# Patient Record
Sex: Male | Born: 1985 | Race: Black or African American | Hispanic: No | Marital: Single | Smoking: Former smoker
Health system: Southern US, Community
[De-identification: ages and names within clinical notes are randomized; demographics above are authoritative.]

---
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
## 2004-11-12 ENCOUNTER — Inpatient Hospital Stay (HOSPITAL_COMMUNITY): Admission: EM | Admit: 2004-11-12 | Discharge: 2004-11-15 | Payer: Self-pay | Admitting: Emergency Medicine

## 2004-11-14 ENCOUNTER — Ambulatory Visit: Payer: Self-pay | Admitting: *Deleted

## 2006-01-15 ENCOUNTER — Emergency Department (HOSPITAL_COMMUNITY): Admission: EM | Admit: 2006-01-15 | Discharge: 2006-01-15 | Payer: Self-pay | Admitting: Emergency Medicine

## 2007-10-15 ENCOUNTER — Emergency Department (HOSPITAL_COMMUNITY): Admission: EM | Admit: 2007-10-15 | Discharge: 2007-10-15 | Payer: Self-pay | Admitting: Emergency Medicine

## 2008-11-11 ENCOUNTER — Emergency Department (HOSPITAL_COMMUNITY): Admission: EM | Admit: 2008-11-11 | Discharge: 2008-11-11 | Payer: Self-pay | Admitting: Emergency Medicine

## 2009-03-14 ENCOUNTER — Emergency Department (HOSPITAL_COMMUNITY): Admission: EM | Admit: 2009-03-14 | Discharge: 2009-03-14 | Payer: Self-pay | Admitting: Orthopaedic Surgery

## 2009-12-11 ENCOUNTER — Emergency Department (HOSPITAL_COMMUNITY): Admission: EM | Admit: 2009-12-11 | Discharge: 2009-12-11 | Payer: Self-pay | Admitting: Emergency Medicine

## 2010-09-21 ENCOUNTER — Inpatient Hospital Stay (HOSPITAL_COMMUNITY)
Admission: AD | Admit: 2010-09-21 | Discharge: 2010-09-21 | Payer: Self-pay | Source: Home / Self Care | Attending: Family Medicine | Admitting: Family Medicine

## 2010-12-01 LAB — POCT CARDIAC MARKERS

## 2011-01-28 NOTE — Discharge Summary (Signed)
[name] [name], [name] [name]                ACCOUNT NO.:  [id number]   MEDICAL RECORD NO.:  [id number]          PATIENT TYPE:  INP   LOCATION:  4740                         FACILITY:  [facility]   PHYSICIAN:  Isidor Holts, M.D.  DATE OF BIRTH:  1986-06-18   DATE OF ADMISSION:  11/12/2004  DATE OF DISCHARGE:  11/15/2004                                 DISCHARGE SUMMARY   DISCHARGE DIAGNOSES:  1.  Suicide attempt.  2.  Ingestion of oxycodone and Clorox Bleach, November 12, 2004.  3.  Previous history of Tylenol overdose, August 13, 2001.  4.  Depression.   DISCHARGE MEDICATIONS:  Protonix 40 mg p.o. daily.   PROCEDURE:  1.  Chest x-ray on November 12, 2004.  This showed no acute disease.  2.  Head CT scan dated November 12, 2004.  This showed mild frontal scalp soft      tissue swelling with no acute intracranial findings.   CONSULTATIONS:  Jasmine Pang, M.D., psychiatry.   HISTORY OF PRESENT ILLNESS:  As in H&P notes of November 12, 2004.  However,  briefly, this is a 25 year old African-American male with a known history of  previous Tylenol overdose/suicide attempt in December of 2002, who presents  following ingestion of Oxycodone and unspecified amount of Clorox bleach in  the a.m. of November 12, 2004, following argument with his probation officer as  well as the minister he was staying with.  Apparently, he had admitted to  suicidal intent.  The patient was admitted for evaluation, investigation,  and management.   HOSPITAL COURSE:  Problem 1.  Oxycodone ingestion/Clorox bleach ingestion.  According to the patient he only took one tablet of oxycodone which he had  obtained from his girlfriend and did not appear to have clinical evidence of  respiratory depression.  He was treated with GI cocktail as well as proton  pump inhibitor for Clorox bleach ingestion and certainly had no vomiting,  hematemesis, or melena during the course of his hospital stay and did not  experience abdominal pain.   Hemoglobin levels were monitored serially and as  of a.m. of November 12, 2004, hemoglobin is stable/normal at 12.9 grams/dL.  The  patient shows no evidence of hemodynamic instability.  Blood pressures  normal at 136/79 mmHg.  Pulse rate is normal at 82 per minute.  The patient  is completely asymptomatic, with no evidence of postural dizziness or  abdominal pain to suggest internal bleed.   Problem 2.  Depression.  The patient was placed under observation with a one-  on-one sitter during the course of his hospital stay.  His mood appeared  stable.  He was evaluated by psychiatry, i.e., Dr. Flo Shanks on November 14, 2004, and recommendation was the patient would benefit from a course of  inpatient management.   DISPOSITION:  The patient is medically cleared and is considered stable for  transfer to psychiatric facility for inpatient management as soon as this is  feasible.   PAIN MANAGEMENT:  Not applicable.   ACTIVITY:  No restrictions.   DIET:  No restrictions.   WOUND  CARE:  Not applicable.   FOLLOWUP:  Psychiatry.      CO/MEDQ  D:  11/15/2004  T:  11/15/2004  Job:  [id number]

## 2011-01-28 NOTE — H&P (Signed)
[facility]  Patient:    [name] [name], [name] [name] Visit Number: [id number] MRN: [id number]          Service Type: PSY Location: 200 0200 02 Attending Physician:  Veneta Penton. Dictated by:   Veneta Penton, M.D. Admit Date:  08/16/2001                     Psychiatric Admission Assessment  DATE OF ADMISSION:  August 16, 2001  PATIENT IDENTIFICATION:  This 25 year old black male was admitted complaining of depression after being involuntarily admitted status post overdose with Tylenol as a suicide attempt.  He was medically cleared and transferred to this facility.  HISTORY OF PRESENT ILLNESS:  The patient complains of an increasingly depressed, irritable, and angry mood most of the day nearly every day over the past one to two months along with anhedonia, decreased school performance, feelings of hopelessness, helplessness, worthlessness, decreased concentration and energy level, increased symptoms of fatigue, psychomotor agitation.  He has been increasingly isolative and withdrawn.  He admits to giving up on activities previously enjoyed.  He admits to insomnia, decreased appetite and weight loss.  He is unable to contract for safety at this time.  PAST PSYCHIATRIC HISTORY:  Inpatient and outpatient at [facility] in 2000 under the care of Dr. Ladona Ridgel.  He is currently on probation for trespassing and assault and he has had multiple previous incarcerations at the [facility] in [facility] for multiple previous criminal charges.  SUBSTANCE ABUSE HISTORY:  He currently denies any drug or alcohol use.  PAST MEDICAL HISTORY:  He denies any history of medical or surgical problems.  ALLERGIES:  TOMATOES.  He has no known drug allergies or sensitivities.  CURRENT MEDICATIONS:  He is on no current medications.  FAMILY AND SOCIAL HISTORY:  The patient resides with his mother.  There is no other family or  social history available at this time.  MENTAL STATUS EXAMINATION:  The patient presents as a well-developed, well-nourished, adolescent black male who is alert and oriented x 4, psychomotor agitated, and whose appearance is compatible with his stated age. He displays poor impulse control, decreased concentration and attention span. Affect and mood are depressed and irritable.  Immediate recall, short-term memory, and remote memory are intact.  Similarities and differences are within normal limits and he is able to abstract to proverbs.  He displays no evidence of a thought disorder.  Thought processes are goal directed.  ADMISSION DIAGNOSES: Axis I:    1. Major depression, recurrent type, severe without psychosis.            2. Conduct disorder.            3. Rule out attention-deficit/hyperactivity disorder. Axis II:   1. Antisocial traits.            2. Rule out personality disorder.            3. Rule out learning disorder, not otherwise specified. Axis III:  Status post Tylenol overdose. Axis IV:   Current psychosocial stressors are severe. Axis V:    20.  ASSETS AND STRENGTHS:  His mother is very supportive of him. He is well connected into the DSS system.  INITIAL PLAN OF CARE:  Begin the patient on a trial of antidepressant medication once informed consent is obtained and a risks/benefits discussion has been held.  At the present time, however, the patient refuses a trial of antidepressant medication.  Psychotherapy  will focus on improving his impulse control, decreasing cognitive distortions, and decreasing potential for harm to self and others.  A laboratory workup will also be initiated to rule out any medical problems contributing to his symptomatology.  ESTIMATED LENGTH OF STAY:  Five to seven days.  POST HOSPITAL CARE PLAN:  Discharge the patient to home.Dictated by:   Veneta Penton, M.D. Attending Physician:  Veneta Penton DD:  08/16/01 TD:   08/16/01 Job: [id number]

## 2011-01-28 NOTE — H&P (Signed)
[name], [name]                ACCOUNT NO.:  [id number]   MEDICAL RECORD NO.:  [id number]          PATIENT TYPE:  INP   LOCATION:  4740                         FACILITY:  [facility]   PHYSICIAN:  Gertha Calkin, M.D.DATE OF BIRTH:  Nov 16, 1985   DATE OF ADMISSION:  11/12/2004  DATE OF DISCHARGE:                                HISTORY & PHYSICAL   HISTORY OF PRESENT ILLNESS:  This is a pleasant, 25 year old, African-  American male with previous overdose/suicide attempt in December 2002, with  Tylenol who presents from an unstable home environment.  His long story  essentially involves his probation officer and one of the ministers that he  was staying with after being released from jail.  Essentially, he had a very  stressful event over the last 24 hours and states at this time, while he was  more awake, that he had taken one oxycodone that his girlfriend has  prescribed to her (just gave birth to a child) and drank an unknown amount  of Clorox bleach earlier this morning.  The patient states that he does not  remember because he feels like he blacked out, but no recollection of  hitting the ground and no scratches or bruises that he can recall or feel at  this time.  He states the last thing he remembers was having the argument  with his probation officer as well as the minister he was staying with and  then taking oxycodone and does not remember how much bleach he took.  In the  ED earlier, during this admission, ADP related that he did voice wanting to  end his life.  However, during my H&P, he is pleasant, laughing, joking and  does not appear to be depressed or wanting to end his life.  He is  definitely worried about being very stressed concerning all the litigation  that may be awaiting him after discharge.   PAST MEDICAL HISTORY:  Significant for previous admission on August 16, 2001, for another overdose with Tylenol at that time.  He was discharged  home with followup  outpatient psychiatry, but opted not to start on  antidepressant medications.  Otherwise, no past medical history.   FAMILY HISTORY:  Noncontributory.   SOCIAL HISTORY:  Recently left jail after spending 3 month sentence for  presumed arson.  He denies smoking, illicit drugs and no tobacco or alcohol  history.  No IV drug use.  He is single.  No kids.  He has a girlfriend.  He  lives here in [place].   REVIEW OF SYSTEMS:  Negative, except for HPI.   PHYSICAL EXAMINATION:  VITAL SIGNS:  Temperature 98.7, blood pressure  131/77, pulse 72, respirations 22, 99% on room air.  GENERAL:  This is a well-developed, well-nourished, African-American male  lying in bed, joking, laughing without any distress.  HEENT:  Unremarkable.  CARDIAC:  Regular rate and rhythm, no murmurs, rubs or gallops.  CHEST:  Clear to auscultation bilaterally.  Good air movement.  ABDOMEN:  Soft, nontender, nondistended, positive bowel sounds.  EXTREMITIES:  Without clubbing, cyanosis or edema.  NEUROLOGIC:  Cranial nerves 2-12 grossly intact.  Alert and oriented x3.   LABORATORY DATA AND X-RAY FINDINGS:  Hemoglobin 14.3, white count 6.8,  platelets 179, MCV 89.  UDS was negative for everything.  Alcohol, aspirin  and acetaminophen levels are negative.  CMET was essentially normal except  for glucose elevated at 134 with total protein and albumin slightly  decreased at 5.9 and 3.4.   CT of his head without contrast showed mild frontal scalp swelling, however,  there were no bruises or tenderness listed during my exam.   ASSESSMENT/PLAN:  1.  Suicide gesture/attempt.  2.  History of depression (the patient denied treatment).   PLAN:  Will admit for psychiatric assessment especially regarding these  episodes where he states that he will become so stressed that he will have a  blackout.  Otherwise treat his possible GI side effects of his Clorox  ingestion with GI cocktail and supportive therapy.  At this time,  I feel  that he is medically stable and pending his psychiatric assessment and  social work consult, which will hopefully help him get into a more stable  environment, he should be ready for discharge when these things are  addressed.      JD/MEDQ  D:  11/12/2004  T:  11/13/2004  Job:  [id number]

## 2011-01-28 NOTE — Discharge Summary (Signed)
[facility]  Patient:    [name] [name], [name] [name] Visit Number: [id number] MRN: [id number]          Service Type: EMS Location: [place] Attending Physician:  Doug Sou Dictated by:   Veneta Penton, M.D. Admit Date:  08/16/2001 Discharge Date: 08/16/2001                             Discharge Summary  REASON FOR ADMISSION:  This 25 year old black male was admitted for depression after being involuntarily admitted status post overdose with Tylenol as a suicide attempt.  For further history of present illness, please see the patients psychiatric admission assessment.  PHYSICAL EXAMINATION AT THE TIME OF ADMISSION:  Entirely unremarkable.  LABORATORY EXAMINATION:  The patient underwent a laboratory workup to rule out any medical problems contributing to his symptomatology.  UA was unremarkable. Urine probe for gonorrhea was negative.  Urine probe for chlamydia was positive and the patient was given 1 g Zithromax p.o. in a one-time dose. Hepatic panel showed indirect bilirubin 1.1, direct bilirubin 0.1, and was otherwise unremarkable.  Basic metabolic panel was within normal limits.  CBC was within normal limits.  TSH and free T4 are pending at the time of discharge.  GGT was unremarkable.  RPR is pending at the time of discharge. Routine metabolic panel was within normal limits.  Urine drug screen was negative.  The patient received no x-rays, no special procedures, no additional consultations.  He sustained no complications during the course of this hospitalization.  HOSPITAL COURSE:  The patient rapidly adapted to unit routine, socializing well with both patients and staff.  On admission, his affect and mood were depressed.  He was psychomotor agitated.  He displayed poor impulse control and decreased concentration.  He participated in all aspects of the therapeutic treatment program.  He has denied any homicidal or suicidal ideation since admission,  has shown no assaultive or aggressive behavior. Consequently, it is felt he has reached his maximum benefits of hospitalization and is ready for discharge to a less restrictive alternative setting.  He continues to be guarded and superficial in group therapy but is motivated for outpatient therapy and no longer appears to be danger to himself or others.  Consequently, he will be discharged to home.  CONDITION ON DISCHARGE:  Improved.  DIAGNOSES: Axis I:    1. Major depression, recurrent type, severe without psychosis.            2. Conduct disorder.            3. Rule out attention-deficit/hyperactivity disorder. Axis II:   1. Antisocial traits.            2. Rule out personality disorder, not otherwise specified.            3. Rule out learning disorder, not otherwise specified. Axis III:  Status post Tylenol overdose as a suicide attempt. Axis IV:   Current psychosocial stressors are severe. Axis V:    20 on admission, 30 on discharge.  FURTHER EVALUATION AND TREATMENT RECOMMENDATIONS: 1. The patient is discharged to home. 2. He is discharged on an unrestricted level of activity and a regular diet. 3. He has refused a trial of antidepressant medication and is consequently    discharged on no medicines. 4. He will follow up with his outpatient psychiatrist for all further aspects    of his psychiatric care and consequently, I will sign off on the case  at    this time. Dictated by:   Veneta Penton, M.D. Attending Physician:  Doug Sou DD:  08/21/01 TD:  08/21/01 Job: [id number]

## 2011-06-03 LAB — I-STAT 8, (EC8 V) (CONVERTED LAB)
Acid-Base Excess: 2
BUN: 13
Chloride: 106
Glucose, Bld: 98
Hemoglobin: 15.6
pH, Ven: 7.376 — ABNORMAL HIGH

## 2011-06-03 LAB — POCT CARDIAC MARKERS
Myoglobin, poc: 51.8
Operator id: 151321

## 2011-06-03 LAB — POCT I-STAT CREATININE: Creatinine, Ser: 1.1

## 2011-06-19 ENCOUNTER — Emergency Department (HOSPITAL_COMMUNITY)
Admission: EM | Admit: 2011-06-19 | Discharge: 2011-06-19 | Disposition: A | Discharge status: Home / Self Care | Payer: Self-pay | Attending: Emergency Medicine | Admitting: Emergency Medicine

## 2011-06-19 DIAGNOSIS — M545 Low back pain, unspecified: Secondary | ICD-10-CM | POA: Insufficient documentation

## 2011-06-19 DIAGNOSIS — M5430 Sciatica, unspecified side: Secondary | ICD-10-CM | POA: Insufficient documentation

## 2011-06-19 LAB — URINALYSIS, ROUTINE W REFLEX MICROSCOPIC
Bilirubin Urine: NEGATIVE
Glucose, UA: NEGATIVE mg/dL
Hgb urine dipstick: NEGATIVE
Specific Gravity, Urine: 1.023 (ref 1.005–1.030)
pH: 6 (ref 5.0–8.0)

## 2012-02-15 ENCOUNTER — Encounter (HOSPITAL_COMMUNITY): Payer: Self-pay | Admitting: *Deleted

## 2012-02-15 ENCOUNTER — Emergency Department (HOSPITAL_COMMUNITY)
Admission: EM | Admit: 2012-02-15 | Discharge: 2012-02-15 | Disposition: A | Discharge status: Home / Self Care | Payer: Self-pay | Attending: Emergency Medicine | Admitting: Emergency Medicine

## 2012-02-15 DIAGNOSIS — B86 Scabies: Secondary | ICD-10-CM | POA: Insufficient documentation

## 2012-02-15 MED ORDER — PERMETHRIN 5 % EX CREA: TOPICAL_CREAM | CUTANEOUS | Status: AC

## 2012-02-15 NOTE — ED Notes (Signed)
Pt was exposed to scabies at work.  Pt reports itching and has rash between fingers.  VS WNL.

## 2012-02-15 NOTE — Discharge Instructions (Signed)
Scabies Scabies are small bugs (mites) that burrow under the skin and cause red bumps and severe itching. These bugs can only be seen with a microscope. Scabies are highly contagious. They can spread easily from person to person by direct contact. They are also spread through sharing clothing or linens that have the scabies mites living in them. It is not unusual for an entire family to become infected through shared towels, clothing, or bedding.  HOME CARE INSTRUCTIONS   Your caregiver may prescribe a cream or lotion to kill the mites. If this cream is prescribed; massage the cream into the entire area of the body from the neck to the bottom of both feet. Also massage the cream into the scalp and face if your child is less than 1 year old. Avoid the eyes and mouth.   Leave the cream on for 8 to12 hours. Do not wash your hands after application. Your child should bathe or shower after the 8 to 12 hour application period. Sometimes it is helpful to apply the cream to your child at right before bedtime.   One treatment is usually effective and will eliminate approximately 95% of infestations. For severe cases, your caregiver may decide to repeat the treatment in 1 week. Everyone in your household should be treated with one application of the cream.   New rashes or burrows should not appear after successful treatment within 24 to 48 hours; however the itching and rash may last for 2 to 4 weeks after successful treatment. If your symptoms persist longer than this, see your caregiver.   Your caregiver also may prescribe a medication to help with the itching or to help the rash go away more quickly.   Scabies can live on clothing or linens for up to 3 days. Your entire child's recently used clothing, towels, stuffed toys, and bed linens should be washed in hot water and then dried in a dryer for at least 20 minutes on high heat. Items that cannot be washed should be enclosed in a plastic bag for at least 3  days.   To help relieve itching, bathe your child in a cool bath or apply cool washcloths to the affected areas.   Your child may return to school after treatment with the prescribed cream.  SEEK MEDICAL CARE IF:   The itching persists longer than 4 weeks after treatment.   The rash spreads or becomes infected (the area has red blisters or yellow-tan crust).  Document Released: 08/29/2005 Document Revised: 08/18/2011 Document Reviewed: 01/07/2009 Exit[employer] Patient Information 2012 [employer]. 

## 2012-02-17 NOTE — ED Provider Notes (Signed)
History     CSN: [id number]  Arrival date & time 02/15/12  1254   First MD Initiated Contact with Patient 02/15/12 1317      Chief Complaint  Patient presents with  . Rash    (Consider location/radiation/quality/duration/timing/severity/associated sxs/prior treatment) HPI Comments: 62 y who presents for concern of scabies.  Pt with puritic rash to arms and fingers and abd.  Pt with no fevers, no drainage. No induration, no boils  Patient is a 26 y.o. male presenting with rash. The history is provided by the patient. No language interpreter was used.  Rash  This is a new problem. The current episode started more than 2 days ago. The problem has been gradually worsening. The problem is associated with an insect bite/sting. There has been no fever. The rash is present on the left hand, abdomen, right arm, right hand and left arm. The patient is experiencing no pain. The pain has been constant since onset. Associated symptoms include itching. Pertinent negatives include no blisters and no pain. He has tried nothing for the symptoms. The treatment provided no relief.    History reviewed. No pertinent past medical history.  History reviewed. No pertinent past surgical history.  No family history on file.  History  Substance Use Topics  . Smoking status: Not on file  . Smokeless tobacco: Not on file  . Alcohol Use: Not on file      Review of Systems  Skin: Positive for itching and rash.  All other systems reviewed and are negative.    Allergies  Review of patient's allergies indicates no known allergies.  Home Medications   Current Outpatient Rx  Name Route Sig Dispense Refill  . PERMETHRIN 5 % EX CREA  Apply to affected area once, then reapply in one week 60 g 0    BP 124/70  Pulse 69  Temp(Src) 97.9 F (36.6 C) (Oral)  Resp 18  SpO2 100%  Physical Exam  Nursing note and vitals reviewed. Constitutional: He is oriented to person, place, and time. He appears  well-developed and well-nourished.  HENT:  Head: Normocephalic and atraumatic.  Eyes: Conjunctivae and EOM are normal.  Neck: Normal range of motion. Neck supple.  Cardiovascular: Normal rate and normal heart sounds.   Pulmonary/Chest: Effort normal and breath sounds normal.  Abdominal: Soft. Bowel sounds are normal.  Musculoskeletal: Normal range of motion.  Neurological: He is alert and oriented to person, place, and time.  Skin: Skin is warm.       Multiple scatter areas of small pinpoint papules on hands arms, webs of finger and some noted on stomach    ED Course  Procedures (including critical care time)  Labs Reviewed - No data to display No results found.   1. Scabies       MDM  63 y with scabies.  Will give permethrin.  Discussed need to retreat in 1 week, and need to clean furniture, bedding, clothes, etc.  Discussed signs that warrant reevaluation.          Chrystine Oiler, MD 02/17/12 [phone]

## 2012-03-03 ENCOUNTER — Encounter (HOSPITAL_COMMUNITY): Payer: Self-pay | Admitting: *Deleted

## 2012-03-03 ENCOUNTER — Emergency Department (HOSPITAL_COMMUNITY)
Admission: EM | Admit: 2012-03-03 | Discharge: 2012-03-03 | Discharge status: Against Medical Advice | Payer: Self-pay | Attending: Emergency Medicine | Admitting: Emergency Medicine

## 2012-03-03 ENCOUNTER — Emergency Department (HOSPITAL_COMMUNITY): Payer: Self-pay

## 2012-03-03 DIAGNOSIS — S0003XA Contusion of scalp, initial encounter: Secondary | ICD-10-CM | POA: Insufficient documentation

## 2012-03-03 DIAGNOSIS — W2209XA Striking against other stationary object, initial encounter: Secondary | ICD-10-CM | POA: Insufficient documentation

## 2012-03-03 DIAGNOSIS — S0990XA Unspecified injury of head, initial encounter: Secondary | ICD-10-CM

## 2012-03-03 DIAGNOSIS — Y92009 Unspecified place in unspecified non-institutional (private) residence as the place of occurrence of the external cause: Secondary | ICD-10-CM | POA: Insufficient documentation

## 2012-03-03 NOTE — ED Notes (Signed)
PA at bedside.

## 2012-03-03 NOTE — ED Notes (Signed)
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  No loc, but pt c/o dizziness (room is moving).

## 2012-03-03 NOTE — ED Notes (Signed)
Pt upset with signifcant other and walked out and stated he is lieaving because his SO is giving him a headache.

## 2012-03-04 NOTE — ED Provider Notes (Signed)
History     CSN: [id number]  Arrival date & time 03/03/12  0037   First MD Initiated Contact with Patient 03/03/12 0144      Chief Complaint  Patient presents with  . Head Injury    (Consider location/radiation/quality/duration/timing/severity/associated sxs/prior treatment) HPI Comments: Patient here after unloading a truck when he turned around and struck his forehead on the frame of the trailer just at the hairline - no laceration noted, no LOC but reports continued dizziness.  Denies neck and back pain, blurred vision, numbness, tingling.  Patient is a 26 y.o. male presenting with head injury. The history is provided by the patient. No language interpreter was used.  Head Injury  The incident occurred 3 to 5 hours ago. He came to the ER via walk-in. The injury mechanism was a direct blow. There was no loss of consciousness. There was no blood loss. The quality of the pain is described as sharp. The pain is at a severity of 5/10. The pain is moderate. The pain has been constant since the injury. Pertinent negatives include no numbness, no blurred vision, no vomiting, no tinnitus, patient does not experience disorientation and no memory loss. He was found conscious by EMS personnel.    History reviewed. No pertinent past medical history.  History reviewed. No pertinent past surgical history.  No family history on file.  History  Substance Use Topics  . Smoking status: Former Games developer  . Smokeless tobacco: Not on file  . Alcohol Use: No      Review of Systems  Constitutional: Negative for fever and chills.  HENT: Negative for neck pain and tinnitus.   Eyes: Negative for blurred vision and pain.  Respiratory: Negative for chest tightness and shortness of breath.   Cardiovascular: Negative for chest pain.  Gastrointestinal: Negative for vomiting and abdominal pain.  Genitourinary: Negative for flank pain.  Musculoskeletal: Negative for back pain.  Neurological: Positive for  dizziness and headaches. Negative for numbness.  Psychiatric/Behavioral: Negative for memory loss.  All other systems reviewed and are negative.    Allergies  Review of patient's allergies indicates no known allergies.  Home Medications  No current outpatient prescriptions on file.  BP 143/90  Pulse 57  Temp 98.3 F (36.8 C) (Oral)  Resp 18  SpO2 99%  Physical Exam  Nursing note and vitals reviewed. Constitutional: He is oriented to person, place, and time. He appears well-developed and well-nourished. No distress.  HENT:  Head: Normocephalic.  Right Ear: External ear normal.  Left Ear: External ear normal.  Nose: Nose normal.  Mouth/Throat: Oropharynx is clear and moist. No oropharyngeal exudate.       Contusion to forehead at the hairline   Eyes: Conjunctivae are normal. Pupils are equal, round, and reactive to light. No scleral icterus.  Neck: Normal range of motion. Neck supple.  Cardiovascular: Normal rate, regular rhythm and normal heart sounds.  Exam reveals no gallop and no friction rub.   No murmur heard. Pulmonary/Chest: Effort normal and breath sounds normal. No respiratory distress. He has no wheezes. He has no rales. He exhibits no tenderness.  Abdominal: Soft. Bowel sounds are normal. He exhibits no distension. There is no tenderness.  Musculoskeletal: Normal range of motion. He exhibits no edema and no tenderness.  Lymphadenopathy:    He has no cervical adenopathy.  Neurological: He is alert and oriented to person, place, and time. No cranial nerve deficit. He exhibits normal muscle tone. Coordination normal.  Skin: Skin is warm and  dry. No rash noted. No erythema. No pallor.  Psychiatric: He has a normal mood and affect. His behavior is normal. Judgment and thought content normal.    ED Course  Procedures (including critical care time)  Labs Reviewed - No data to display No results found.   1. Minor head injury       MDM  I had ordered CT scan  of his head, but the patient became so frustrated with his girlfriend who was insisting on lying in the bed that he ended up leaving AMA without telling staff.       Izola Price Springer, [place] 03/04/12 [phone]

## 2012-03-04 NOTE — ED Provider Notes (Signed)
Medical screening examination/treatment/procedure(s) were performed by non-physician practitioner and as supervising physician I was immediately available for consultation/collaboration.   Dayton Bailiff, MD 03/04/12 [phone]

## 2012-06-17 ENCOUNTER — Emergency Department (HOSPITAL_COMMUNITY)
Admission: EM | Admit: 2012-06-17 | Discharge: 2012-06-17 | Disposition: A | Discharge status: Home / Self Care | Payer: Self-pay | Attending: Emergency Medicine | Admitting: Emergency Medicine

## 2012-06-17 ENCOUNTER — Encounter (HOSPITAL_COMMUNITY): Payer: Self-pay | Admitting: *Deleted

## 2012-06-17 DIAGNOSIS — R252 Cramp and spasm: Secondary | ICD-10-CM | POA: Insufficient documentation

## 2012-06-17 DIAGNOSIS — E860 Dehydration: Secondary | ICD-10-CM | POA: Insufficient documentation

## 2012-06-17 DIAGNOSIS — E876 Hypokalemia: Secondary | ICD-10-CM | POA: Insufficient documentation

## 2012-06-17 DIAGNOSIS — Z87891 Personal history of nicotine dependence: Secondary | ICD-10-CM | POA: Insufficient documentation

## 2012-06-17 DIAGNOSIS — R112 Nausea with vomiting, unspecified: Secondary | ICD-10-CM | POA: Insufficient documentation

## 2012-06-17 LAB — POCT I-STAT, CHEM 8
BUN: 22 mg/dL (ref 6–23)
Creatinine, Ser: 1.5 mg/dL — ABNORMAL HIGH (ref 0.50–1.35)
Glucose, Bld: 75 mg/dL (ref 70–99)
HCT: 46 % (ref 39.0–52.0)
Hemoglobin: 15.6 g/dL (ref 13.0–17.0)
Potassium: 3 mEq/L — ABNORMAL LOW (ref 3.5–5.1)
TCO2: 22 mmol/L (ref 0–100)

## 2012-06-17 LAB — CBC
HCT: 38.7 % — ABNORMAL LOW (ref 39.0–52.0)
Hemoglobin: 13.9 g/dL (ref 13.0–17.0)
MCH: 32.6 pg (ref 26.0–34.0)
MCV: 90.8 fL (ref 78.0–100.0)
RDW: 13 % (ref 11.5–15.5)
WBC: 14.6 10*3/uL — ABNORMAL HIGH (ref 4.0–10.5)

## 2012-06-17 LAB — COMPREHENSIVE METABOLIC PANEL
ALT: 25 U/L (ref 0–53)
Albumin: 4.6 g/dL (ref 3.5–5.2)
Chloride: 101 mEq/L (ref 96–112)
GFR calc Af Amer: 76 mL/min — ABNORMAL LOW (ref >90)
Sodium: 139 mEq/L (ref 135–145)
Total Bilirubin: 0.5 mg/dL (ref 0.3–1.2)
Total Protein: 7.6 g/dL (ref 6.0–8.3)

## 2012-06-17 LAB — URINE MICROSCOPIC-ADD ON

## 2012-06-17 LAB — URINALYSIS, ROUTINE W REFLEX MICROSCOPIC
Glucose, UA: NEGATIVE mg/dL
Leukocytes, UA: NEGATIVE
Specific Gravity, Urine: 1.029 (ref 1.005–1.030)

## 2012-06-17 MED ORDER — POTASSIUM CHLORIDE CRYS ER 20 MEQ PO TBCR
40.0000 meq | EXTENDED_RELEASE_TABLET | Freq: Once | ORAL | Status: AC
  Administered 2012-06-17: 40 meq via ORAL
  Filled 2012-06-17: qty 2

## 2012-06-17 MED ORDER — DIAZEPAM 5 MG PO TABS
5.0000 mg | ORAL_TABLET | Freq: Once | ORAL | Status: AC
  Administered 2012-06-17: 5 mg via ORAL
  Filled 2012-06-17: qty 1

## 2012-06-17 MED ORDER — SODIUM CHLORIDE 0.9 % IV SOLN
INTRAVENOUS | Status: DC
  Administered 2012-06-17: 02:00:00 via INTRAVENOUS

## 2012-06-17 MED ORDER — SODIUM CHLORIDE 0.9 % IV BOLUS (SEPSIS)
1000.0000 mL | Freq: Once | INTRAVENOUS | Status: AC
  Administered 2012-06-17: 1000 mL via INTRAVENOUS

## 2012-06-17 MED ORDER — ONDANSETRON HCL 4 MG PO TABS: 4.0000 mg | ORAL_TABLET | Freq: Four times a day (QID) | ORAL | Status: DC

## 2012-06-17 MED ORDER — ONDANSETRON HCL 4 MG/2ML IJ SOLN
4.0000 mg | Freq: Once | INTRAMUSCULAR | Status: AC
  Administered 2012-06-17: 4 mg via INTRAVENOUS
  Filled 2012-06-17: qty 2

## 2012-06-17 NOTE — ED Notes (Signed)
Patient currently asleep in bed; no respiratory or acute distress noted.  Family updated on plan of care; informed patient that we are currently waiting on further orders/disposition from EDP.  Family member given coffee to drink.  Family has no other questions or concerns at this time; will continue to monitor.

## 2012-06-17 NOTE — ED Notes (Signed)
Phlebotomist currently at bedside. 

## 2012-06-17 NOTE — ED Notes (Signed)
Patient currently resting quietly in bed; no respiratory or acute distress noted.  Patient updated on plan of care; informed patient that we need a urine sample.  Patient states that he is unable to go at this time; urinal placed at bedside.  Patient has no other questions or concerns at this time; will continue to monitor.

## 2012-06-17 NOTE — ED Notes (Signed)
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  Patient reports poor oral fluid intake today; patient has a history of muscle cramps due (unsure of diagnosis in the past).  Patient denies chest pain, shortness of breath, and dizziness.  Reports nausea; vomited x1 upon arrival to ED.  Patient alert and oriented x4; PERRL present.  Upon arrival to room, patient changed into gown and connected to continuous cardiac, pulse ox, and blood pressure monitor.  Will continue to monitor.

## 2012-06-17 NOTE — ED Notes (Signed)
Patient vomited x1 again; Dr. Dierdre Highman notified that patient requesting more nausea medication.  Will continue to monitor.

## 2012-06-17 NOTE — ED Notes (Signed)
Patient currently resting quietly in bed; no respiratory or acute distress noted.  Patient updated on plan of care; informed patient that we are currently waiting on further orders from EDP; patient has no other questions or concerns at this time; will continue to monitor.

## 2012-06-17 NOTE — ED Notes (Signed)
Patient currently asleep in bed; no respiratory or acute distress noted.  Family present at bedside.  Will continue to monitor. 

## 2012-06-17 NOTE — ED Provider Notes (Signed)
History     CSN: [id number]  Arrival date & time 06/17/12  0135   First MD Initiated Contact with Patient 06/17/12 0142      Chief Complaint  Patient presents with  . Leg cramps   . 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 EMS was called and vomited x 1 on arrival to the ED. No ABD pain, no fevers, no sick contacts. No rashes. Moderate in severity  History reviewed. No pertinent past medical history.  History reviewed. No pertinent past surgical history.  History reviewed. No pertinent family history.  History  Substance Use Topics  . Smoking status: Former Games developer  . Smokeless tobacco: Not on file  . Alcohol Use: No      Review of Systems  Constitutional: Negative for fever and chills.  HENT: Negative for neck pain and neck stiffness.   Eyes: Negative for pain.  Respiratory: Negative for shortness of breath.   Cardiovascular: Negative for chest pain.  Gastrointestinal: Positive for nausea. Negative for abdominal pain.  Genitourinary: Negative for hematuria and difficulty urinating.  Musculoskeletal: Negative for back pain.  Skin: Negative for rash.  Neurological: Negative for syncope and headaches.  All other systems reviewed and are negative.    Allergies  Tomato  Home Medications  No current outpatient prescriptions on file.  BP 122/71  Pulse 62  Resp 18  SpO2 100%  Physical Exam  Constitutional: He is oriented to person, place, and time. He appears well-developed and well-nourished.  HENT:  Head: Normocephalic and atraumatic.       Dry mm  Eyes: Conjunctivae normal and EOM are normal. Pupils are equal, round, and reactive to light.  Neck: Trachea normal. Neck supple. No  thyromegaly present.  Cardiovascular: Normal rate, regular rhythm, S1 normal, S2 normal and normal pulses.     No systolic murmur is present   No diastolic murmur is present  Pulses:      Radial pulses are 2+ on the right side, and 2+ on the left side.  Pulmonary/Chest: Effort normal and breath sounds normal. He has no wheezes. He has no rhonchi. He has no rales.  Abdominal: Soft. Normal appearance and bowel sounds are normal. There is no tenderness. There is no CVA tenderness and negative Murphy's sign.  Musculoskeletal: He exhibits no edema and no tenderness.       BLE:s no spasm, calves nontender, no cords or erythema, negative Homans sign  Neurological: He is alert and oriented to person, place, and time. He has normal strength. No cranial nerve deficit or sensory deficit. GCS eye subscore is 4. GCS verbal subscore is 5. GCS motor subscore is 6.  Skin: Skin is warm and dry. No rash noted. He is not diaphoretic.  Psychiatric: His speech is normal.       Cooperative and appropriate    ED Course  Procedures (including critical care time)  Results for orders placed during the hospital encounter of 06/17/12  CBC      Component Value Range   WBC 14.6 (*) 4.0 - 10.5 K/uL   RBC 4.26  4.22 - 5.81 MIL/uL   Hemoglobin 13.9  13.0 - 17.0 g/dL   HCT 40.9 (*) 81.1 - 91.4 %   MCV 90.8  78.0 - 100.0 fL  MCH 32.6  26.0 - 34.0 pg   MCHC 35.9  30.0 - 36.0 g/dL   RDW 65.7  84.6 - 96.2 %   Platelets 192  150 - 400 K/uL  COMPREHENSIVE METABOLIC PANEL      Component Value Range   Sodium 139  135 - 145 mEq/L   Potassium 3.6  3.5 - 5.1 mEq/L   Chloride 101  96 - 112 mEq/L   CO2 21  19 - 32 mEq/L   Glucose, Bld 80  70 - 99 mg/dL   BUN 21  6 - 23 mg/dL   Creatinine, Ser 9.52 (*) 0.50 - 1.35 mg/dL   Calcium 9.9  8.4 - 84.1 mg/dL   Total Protein 7.6  6.0 - 8.3 g/dL   Albumin 4.6  3.5 - 5.2 g/dL   AST 40 (*) 0 - 37 U/L   ALT 25  0 - 53 U/L   Alkaline Phosphatase 62  39 - 117 U/L   Total  Bilirubin 0.5  0.3 - 1.2 mg/dL   GFR calc non Af Amer 65 (*) >90 mL/min   GFR calc Af Amer 76 (*) >90 mL/min  URINALYSIS, ROUTINE W REFLEX MICROSCOPIC      Component Value Range   Color, Urine YELLOW  YELLOW   APPearance CLOUDY (*) CLEAR   Specific Gravity, Urine 1.029  1.005 - 1.030   pH 5.5  5.0 - 8.0   Glucose, UA NEGATIVE  NEGATIVE mg/dL   Hgb urine dipstick NEGATIVE  NEGATIVE   Bilirubin Urine SMALL (*) NEGATIVE   Ketones, ur >80 (*) NEGATIVE mg/dL   Protein, ur 30 (*) NEGATIVE mg/dL   Urobilinogen, UA 0.2  0.0 - 1.0 mg/dL   Nitrite NEGATIVE  NEGATIVE   Leukocytes, UA NEGATIVE  NEGATIVE  POCT I-STAT, CHEM 8      Component Value Range   Sodium 143  135 - 145 mEq/L   Potassium 3.0 (*) 3.5 - 5.1 mEq/L   Chloride 105  96 - 112 mEq/L   BUN 22  6 - 23 mg/dL   Creatinine, Ser 3.24 (*) 0.50 - 1.35 mg/dL   Glucose, Bld 75  70 - 99 mg/dL   Calcium, Ion 4.01  0.27 - 1.23 mmol/L   TCO2 22  0 - 100 mmol/L   Hemoglobin 15.6  13.0 - 17.0 g/dL   HCT 25.3  66.4 - 40.3 %  URINE MICROSCOPIC-ADD ON      Component Value Range   Squamous Epithelial / LPF RARE  RARE   WBC, UA 0-2  <3 WBC/hpf   RBC / HPF 0-2  <3 RBC/hpf   Bacteria, UA FEW (*) RARE   Casts GRANULAR CAST (*) NEGATIVE   Crystals CA OXALATE CRYSTALS (*) NEGATIVE   Urine-Other MUCOUS PRESENT      IVFs, potassium. Zofran.   Labs reviewed as above.    4:09 AM after 2L IVFs, PO fluids, potassium, still having some cramping.   Valium and zofran repeated with cont IVFs.  UA reviewed and noted ketones.   At 6am feeling better, has had PO fluids, 4L IVFs and states understanding all d/c and f/u instructions - he feels comfortable to go home. Dehydration precautions verbalized as understood. He is now having good UOP, initially very dark urine. Cramping resolved.   MDM   Otherwise healthy adult male with cramping and clinical dehydration improved with IVFs, PO fluids. Hypokalemia treated with potassium.         Sunnie Nielsen, MD  06/18/12 0157 

## 2012-06-17 NOTE — ED Notes (Signed)
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

## 2014-08-31 ENCOUNTER — Emergency Department (HOSPITAL_COMMUNITY)
Admission: EM | Admit: 2014-08-31 | Discharge: 2014-08-31 | Disposition: A | Discharge status: Home / Self Care | Payer: Self-pay | Attending: Emergency Medicine | Admitting: Emergency Medicine

## 2014-08-31 ENCOUNTER — Encounter (HOSPITAL_COMMUNITY): Payer: Self-pay | Admitting: Emergency Medicine

## 2014-08-31 DIAGNOSIS — L03211 Cellulitis of face: Secondary | ICD-10-CM | POA: Insufficient documentation

## 2014-08-31 DIAGNOSIS — Z87891 Personal history of nicotine dependence: Secondary | ICD-10-CM | POA: Insufficient documentation

## 2014-08-31 MED ORDER — SULFAMETHOXAZOLE-TRIMETHOPRIM 800-160 MG PO TABS: 1.0000 | ORAL_TABLET | Freq: Two times a day (BID) | ORAL | Status: DC

## 2014-08-31 MED ORDER — SULFAMETHOXAZOLE-TRIMETHOPRIM 800-160 MG PO TABS
1.0000 | ORAL_TABLET | Freq: Once | ORAL | Status: AC
  Administered 2014-08-31: 1 via ORAL
  Filled 2014-08-31: qty 1

## 2014-08-31 NOTE — ED Notes (Signed)
Pt alert and oriented x4. Respirations even and unlabored, bilateral symmetrical rise and fall of chest. Skin warm and dry. In no acute distress. Denies needs.  md at bedside 

## 2014-08-31 NOTE — ED Notes (Signed)
Pt escorted to discharge window. Pt verbalized understanding discharge instructions. In no acute distress.  

## 2014-08-31 NOTE — ED Provider Notes (Signed)
CSN: [id number]     Arrival date & time 08/31/14  1105 History   First MD Initiated Contact with Patient 08/31/14 1244     Chief Complaint  Patient presents with  . Facial Swelling     (Consider location/radiation/quality/duration/timing/severity/associated sxs/prior Treatment) HPI  Patient presents with concern of ongoing face lesion. Symptoms began 3 days ago.  Symptoms actually improved over the past day.  He notes that from onset he has developed erythema, swelling about the left maxillary prominence, with spread superiorly, laterally. It is painful, erythematous, edematous, with minor decrease in visual field secondary to swelling. No substantial change in visual capacity. No concurrent fever, difficulty breathing or speaking. Patient took a sleeping medication, which happened to be Benadryl, but no other specific interventions prior to spontaneous improvement. Patient states that he is generally well, does not recall a specific precipitant to the illness.   History reviewed. No pertinent past medical history. History reviewed. No pertinent past surgical history. History reviewed. No pertinent family history. History  Substance Use Topics  . Smoking status: Former Games developermoker  . Smokeless tobacco: Not on file  . Alcohol Use: No    Review of Systems  All other systems reviewed and are negative.     Allergies  Tomato  Home Medications   Prior to Admission medications   Medication Sig Start Date End Date Taking? Authorizing Provider  ondansetron (ZOFRAN) 4 MG tablet Take 1 tablet (4 mg total) by mouth every 6 (six) hours. Patient not taking: Reported on 08/31/2014 06/17/12   Sunnie NielsenBrian Opitz, MD   BP 122/87 mmHg  Pulse 93  Temp(Src) 98.1 F (36.7 C) (Oral)  Resp 16  SpO2 100% Physical Exam  Constitutional: He is oriented to person, place, and time. He appears well-developed. No distress.  HENT:  Head: Normocephalic and atraumatic.  Erythema, minor edema about the left  maxillary prominence, with trace swelling about upper and lower eyelid on the left. No drainage, bleeding, discharge, fluctuant areas. No TMJ tenderness, deformity, malocclusion. Patient has an accessory premolar on the right lower teeth No left-sided dental caries or erythema or drainage.   Eyes: Conjunctivae and EOM are normal. Right eye exhibits no discharge. Left eye exhibits no discharge.  Cardiovascular: Normal rate and regular rhythm.   Pulmonary/Chest: Effort normal. No stridor. No respiratory distress.  Abdominal: He exhibits no distension.  Musculoskeletal: He exhibits no edema.  Neurological: He is alert and oriented to person, place, and time.  Skin: Skin is warm and dry.  Psychiatric: He has a normal mood and affect.  Nursing note and vitals reviewed.   ED Course  Procedures (including critical care time) Imaging not indicated  MDM   Final diagnoses:  Cellulitis of face    Patient presents with concern of new facial lesion, which is actually improving. Patient has no evidence of visual deficit, orbital cellulitis. Patient has no evidence for cavernous vein thrombosis either given the absence of headache, neurologic changes. No evidence for sepsis, bacteremia. Patient was started on oral antibiotics, received teaching on warm compresses, discharged in stable condition.   Gerhard Munchobert Lonzo Saulter, MD 08/31/14 [phone]

## 2014-08-31 NOTE — ED Notes (Signed)
Patient c/o swelling to left eye, much worse today. Also c/o of difficulty swallowing. CBG 66. Took 25mg  Benadryl last night.

## 2014-08-31 NOTE — Discharge Instructions (Signed)
As discussed, it is important to monitor your condition carefully, and take all medication as directed.  If you develop new, or concerning changes in your condition, please be sure to return here immediately for further evaluation and management.  Otherwise, please use warm compresses 4 times daily until your symptoms have resolved.   Cellulitis Cellulitis is an infection of the skin and the tissue under the skin. The infected area is usually red and tender. This happens most often in the arms and lower legs. HOME CARE   Take your antibiotic medicine as told. Finish the medicine even if you start to feel better.  Keep the infected arm or leg raised (elevated).  Put a warm cloth on the area up to 4 times per day.  Only take medicines as told by your doctor.  Keep all doctor visits as told. GET HELP IF:  You see red streaks on the skin coming from the infected area.  Your red area gets bigger or turns a dark color.  Your bone or joint under the infected area is painful after the skin heals.  Your infection comes back in the same area or different area.  You have a puffy (swollen) bump in the infected area.  You have new symptoms.  You have a fever. GET HELP RIGHT AWAY IF:   You feel very sleepy.  You throw up (vomit) or have watery poop (diarrhea).  You feel sick and have muscle aches and pains. MAKE SURE YOU:   Understand these instructions.  Will watch your condition.  Will get help right away if you are not doing well or get worse. Document Released: 02/15/2008 Document Revised: 01/13/2014 Document Reviewed: 11/14/2011 [facility] Patient Information 2015 [place], [place]. This information is not intended to replace advice given to you by your health care provider. Make sure you discuss any questions you have with your health care provider.

## 2015-06-23 ENCOUNTER — Emergency Department (HOSPITAL_COMMUNITY)
Admission: EM | Admit: 2015-06-23 | Discharge: 2015-06-23 | Disposition: A | Discharge status: Home / Self Care | Payer: Self-pay | Attending: Emergency Medicine | Admitting: Emergency Medicine

## 2015-06-23 ENCOUNTER — Encounter (HOSPITAL_COMMUNITY): Payer: Self-pay | Admitting: *Deleted

## 2015-06-23 DIAGNOSIS — Z87891 Personal history of nicotine dependence: Secondary | ICD-10-CM | POA: Insufficient documentation

## 2015-06-23 DIAGNOSIS — M62830 Muscle spasm of back: Secondary | ICD-10-CM | POA: Insufficient documentation

## 2015-06-23 DIAGNOSIS — M545 Low back pain: Secondary | ICD-10-CM | POA: Insufficient documentation

## 2015-06-23 DIAGNOSIS — M549 Dorsalgia, unspecified: Secondary | ICD-10-CM

## 2015-06-23 MED ORDER — METHOCARBAMOL 500 MG PO TABS
500.0000 mg | ORAL_TABLET | Freq: Two times a day (BID) | ORAL | Status: DC
Start: 2015-06-23 — End: 2016-08-23

## 2015-06-23 MED ORDER — IBUPROFEN 800 MG PO TABS
800.0000 mg | ORAL_TABLET | Freq: Three times a day (TID) | ORAL | Status: DC
Start: 2015-06-23 — End: 2016-08-23

## 2015-06-23 MED ORDER — DIAZEPAM 5 MG/ML IJ SOLN
5.0000 mg | Freq: Once | INTRAMUSCULAR | Status: AC
  Administered 2015-06-23: 5 mg via INTRAMUSCULAR
  Filled 2015-06-23: qty 2

## 2015-06-23 MED ORDER — KETOROLAC TROMETHAMINE 60 MG/2ML IM SOLN
60.0000 mg | Freq: Once | INTRAMUSCULAR | Status: AC
  Administered 2015-06-23: 60 mg via INTRAMUSCULAR
  Filled 2015-06-23: qty 2

## 2015-06-23 NOTE — ED Notes (Signed)
Patient states he does not have someone to pick him up-still drowsy from meds-will continue to monitor-denies pain at this time

## 2015-06-23 NOTE — ED Provider Notes (Signed)
CSN: [id number]     Arrival date & time 06/23/15  0612 History   First MD Initiated Contact with Patient 06/23/15 0617     Chief Complaint  Patient presents with  . Back Pain     (Consider location/radiation/quality/duration/timing/severity/associated sxs/prior Treatment) Patient is a 29 y.o. male presenting with back pain. The history is provided by the patient and medical records.  Back Pain  29 y.o. M with no significant PMH presenting to the ED for back pain.  Patient states back began hurting last night around 2100.  No noted injury, trauma, or falls.  Patient states he was laying in bed and back just began hurting.  He states his back is "locked up" and "tight".  He denies recent heavy lifting or injury.  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  Pain worse with weight bearing and ambulation.  No numbness, paresthesias or weakness of lower extremities.  No loss of bowel or bladder control.  Normal gait.  No intervention tried PTA.  No hx of prior back problems or surgeries.  VSS.  History reviewed. No pertinent past medical history. History reviewed. No pertinent past surgical history. History reviewed. No pertinent family history. Social History  Substance Use Topics  . Smoking status: Former Games developer  . Smokeless tobacco: None  . Alcohol Use: No    Review of Systems  Musculoskeletal: Positive for back pain.  All other systems reviewed and are negative.     Allergies  Tomato  Home Medications   Prior to Admission medications   Medication Sig Start Date End Date Taking? Authorizing Provider  diphenhydrAMINE (BENADRYL) 25 MG tablet Take 25 mg by mouth every 6 (six) hours as needed for itching or allergies.    Historical Provider, MD  ondansetron (ZOFRAN) 4 MG tablet Take 1 tablet (4 mg total) by mouth every 6 (six) hours. Patient not taking: Reported on 08/31/2014 06/17/12   Sunnie Nielsen, MD  sulfamethoxazole-trimethoprim  (BACTRIM DS,SEPTRA DS) 800-160 MG per tablet Take 1 tablet by mouth 2 (two) times daily. Patient not taking: Reported on 06/23/2015 08/31/14   Gerhard Munch, MD   BP 124/65 mmHg  Pulse 66  Temp(Src) 97.7 F (36.5 C) (Oral)  Resp 20  SpO2 98%   Physical Exam  Constitutional: He is oriented to person, place, and time. He appears well-developed and well-nourished.  HENT:  Head: Normocephalic and atraumatic.  Mouth/Throat: Oropharynx is clear and moist.  Eyes: Conjunctivae and EOM are normal. Pupils are equal, round, and reactive to light.  Neck: Normal range of motion.  Cardiovascular: Normal rate, regular rhythm and normal heart sounds.   Pulmonary/Chest: Effort normal and breath sounds normal. No respiratory distress. He has no wheezes.  Abdominal: Soft. Bowel sounds are normal.  Musculoskeletal: Normal range of motion.       Lumbar back: He exhibits tenderness, pain and spasm. He exhibits no bony tenderness.       Back:  TTP of left paraspinal region with spasm noted; no midline tenderness or deformities; limited ROM due to poor patient effort; normal strength and sensation of BLE; DP pulses intact bilaterally  Neurological: He is alert and oriented to person, place, and time.  Skin: Skin is warm and dry.  Psychiatric: He has a normal mood and affect.  Nursing note and vitals reviewed.   ED Course  Procedures (including critical care time) Labs Review Labs Reviewed - No data to display  Imaging Review  No results found.    EKG Interpretation None      MDM   Final diagnoses:  Back pain, unspecified location   29 year old male here with left lower back pain which began yesterday evening. No injury, calm, or falls. Patient is afebrile, nontoxic. He has tenderness of his left paraspinal region with spasm noted. No midline step-off or deformity. Neurologically intact without deficits to suggest cauda equina. Suspect pain due to muscular spasm. Patient was treated with  Toradol and Valium with resolution of pain. He'll be discharged home with supportive care.  Rx robaxin and motrin. Encouraged follow-up with PCP, given resource guide as well.  Discussed plan with patient, he/she acknowledged understanding and agreed with plan of care.  Return precautions given for new or worsening symptoms.  Garlon Hatchet, PA-C 06/23/15 1044  April Palumbo, MD 06/29/15 [phone]

## 2015-06-23 NOTE — ED Notes (Signed)
Pt is from home and complaining of left lower lumbar pain. Pt was brought in by [facility]. Pt denies any injury and pain started at 21:00 last night. Pt has not tried any OTC medication. Pain worse with ambulation and sitting straight up helps. Nausea with no vomiting.

## 2015-06-23 NOTE — Discharge Instructions (Signed)
Take the prescribed medication as directed.  May also with to apply heat to back to help with soreness. Follow-up with your primary care physician.  If you do not have one, see resource guide. Return to the ED for new or worsening symptoms.   Emergency Department Resource Guide 1) Find a Doctor and Pay Out of Pocket Although you won't have to find out who is covered by your insurance plan, it is a good idea to ask around and get recommendations. You will then need to call the office and see if the doctor you have chosen will accept you as a new patient and what types of options they offer for patients who are self-pay. Some doctors offer discounts or will set up payment plans for their patients who do not have insurance, but you will need to ask so you aren't surprised when you get to your appointment.  2) Contact Your Local Health Department Not all health departments have doctors that can see patients for sick visits, but many do, so it is worth a call to see if yours does. If you don't know where your local health department is, you can check in your phone book. The CDC also has a tool to help you locate your state's health department, and many state websites also have listings of all of their local health departments.  3) Find a Walk-in Clinic If your illness is not likely to be very severe or complicated, you may want to try a walk in clinic. These are popping up all over the country in pharmacies, drugstores, and shopping centers. They're usually staffed by nurse practitioners or physician assistants that have been trained to treat common illnesses and complaints. They're usually fairly quick and inexpensive. However, if you have serious medical issues or chronic medical problems, these are probably not your [place] option.  No Primary Care Doctor: - Call Health Connect at  [phone] - they can help you locate a primary care doctor that  accepts your insurance, provides certain services,  etc. - Physician Referral Service- [phone]  Chronic Pain Problems: Organization         Address  Phone   Notes  [facility]  [phone] Patients need to be referred by their primary care doctor.   Medication Assistance: Organization         Address  Phone   Notes  [facility] Medication [facility] [address] [place], [place] [address] [phone] --Must be a resident of [facility] -- Must have NO insurance coverage whatsoever (no Medicaid/ Medicare, etc.) -- The pt. MUST have a primary care doctor that directs their care regularly and follows them in the community   MedAssist  [phone]   [employer]  [phone]    Agencies that provide inexpensive medical care: Organization         Address  Phone   Notes  [facility]  [phone]   [facility]    [phone]   [facility] [address] [place], [place] [address] [phone]   [facility] [address] [place] [place] [phone]   Planned Parenthood    [phone]   [facility]    [phone]   Community Health and [facility]  [address], [place] Phone:  [phone], Fax:  [phone] Hours of Operation:  9 am - 6 pm, M-F.  Also accepts Medicaid/Medicare and self-pay.  [facility] for [place] [address] [place], [address], [place] Phone: [phone], Fax: [phone]. Hours of Operation:  8:30 am - 5:30 pm, M-F.  Also accepts Medicaid and self-pay.  [facility] [address] [place] Phone: [phone]   [place], [place], [place] [phone], Ext. [phone] Mondays & Thursdays: 7-9 AM.  First 15 patients are seen on a first come, first serve basis.    [place] Providers:  Organization         Address  Phone   Notes  [facility] [address],  [phone] Also accepts self-pay patients.  [facility] [address], [place]  [phone]   [place], [address], [place] [phone]   [facility] [address] [place] [phone]   Lucianne Lei [address], [place]   [phone] Only accepts [place] Access [place] patients after they have their name applied to their card.   Self-Pay (no insurance) in [facility]:  Organization         Address  Phone   Notes  Sickle Cell Patients, [facility] Internal Medicine [place] [phone]   [facility] [place] [phone]   [facility] [place]  [place], [facility], [place] [phone]   [facility]/Dr. Osei-Bonsu  [address], [place] or [address], [place] [phone] Phone number for both [place] and [place] locations is the same.  Urgent Medical and [facility] [address] [place] [phone]   [facility] [address] [place] or [address] [phone]   [facility] [address], [place] [phone], phone; [phone], fax Sees patients 1st and 3rd Saturday of every month.  Must not qualify for public or private insurance (i.e. Medicaid, Medicare, [place] Health Choice, Veterans' Benefits)  Household income should be no more than 200% of the poverty level The clinic cannot treat you if you are pregnant or think you are pregnant  Sexually transmitted diseases are not treated at the clinic.    Dental Care: Organization         Address  Phone  Notes  [facility] Department of [facility] [phone] Accepts children up to  age 51 who are enrolled in [place] or [place]; pregnant women with a Medicaid card; and children who have applied for Medicaid or [place] Health Choice, but were declined, whose parents can pay a reduced fee at time of service.  [facility] Department of [facility]  [address], [place] [phone] Accepts children up to age 24 who are enrolled in [place] or [place]; pregnant women with a Medicaid card; and children who have applied for Medicaid or [place] Health Choice, but were declined, whose parents can pay a reduced fee at time of service.  [place] Adult [facility] Access PROGRAM  [place] [phone] Patients are seen by appointment only. Walk-ins are not accepted. [place] will see patients 54 years of age and older. Monday - Tuesday (  8am-5pm) Most Wednesdays (8:30-5pm) $30 per visit, cash only  [facility] Adult Dental Access PROGRAM  [address], [facility] [phone] Patients are seen by appointment only. Walk-ins are not accepted. [place] will see patients 2 years of age and older. One Wednesday Evening (Monthly: Volunteer Based).  $30 per visit, cash only  [place]  [phone] for adults; Children under age 70, call Graduate Pediatric Dentistry at [phone]. Children aged 26-14, please call [phone] to request a pediatric application.  Dental services are provided in all areas of dental care including fillings, crowns and bridges, complete and partial dentures, implants, gum treatment, root canals, and extractions. Preventive care is also provided. Treatment is provided to both adults and children. Patients are selected via a lottery and there is often a waiting list.   [facility] [address], [place]  [phone] [email or web address]   [facility] [address] [place], [place] [phone], Ext. [phone] Second and Fourth Thursday of  each month, opens at 6:30 AM; Clinic ends at 9 AM.  Patients are seen on a first-come first-served basis, and a limited number are seen during each clinic.   [facility]  [address] Hillard Danker [place], [place] [phone]   Eligibility Requirements You must have lived in [place], [place], or [place] counties for at least the last three months.   You cannot be eligible for state or federal sponsored [employer], including [employer], [place], or [employer].   You generally cannot be eligible for healthcare insurance through your employer.    How to apply: Eligibility screenings are held every Tuesday and Wednesday afternoon from 1:00 pm until 4:00 pm. You do not need an appointment for the interview!  [facility] [address], [place], [place]   [place]  Grants Department  [place]  [phone]    Behavioral Health Resources in the Community: Intensive Outpatient Programs Organization         Address  Phone  Notes  [place]. [address] [place], [place] [phone]   [facility] Outpatient [address], [place], [place]   ADS: Alcohol & Drug Svcs [address] [place], [place]   [place] [address],  [place], [place] or [phone]   Substance Abuse Resources Organization         Address  Phone  Notes  Alcohol and Drug Services  [phone]   [place]  [phone]   [facility] [place]   Chinita Pester  [phone]   Residential & Outpatient Substance Abuse Program  [phone]   Psychological Services Organization         Address  Phone  Notes  [facility] [place]  [place]  [phone]   [place] [address]  [place] or [phone]    Mobile Crisis Teams Organization         Address  Phone  Notes  [facility], Mobile Crisis Care Unit  [phone]   [facility]  [address] [place], [place]   [place] [address], [place] [phone]    Self-Help/Support Groups Organization         Address  Phone  Notes  Mental Health Assoc. of [place] - variety of support groups  [place] Call for more information  Narcotics Anonymous (NA), [facility] [address], [employer] [place]  2 meetings at this location   Special educational needs teacher         Address  Phone  Notes  ASAP Residential Treatment [place],    [place]  [phone]   [facility]  [address], [place] [phone], [place], [place]   [place] [address] [place], [place] [phone] Admissions: 8am-3pm M-F  Incentives Substance [place] [address],    [place], [place] [phone]   [facility] [address] [place], [place], [place]   [facility] [address],  [place], [place]   [facility] - Intensive Outpatient [address] Kristeen [address], [place], [place]   [facility] ([place].,  [place], [place] [phone] or [phone]   [facility] Services (RTS) [address], [place], [place] Accepts Medicaid  [facility] [place] [address],  [place] [phone] Substance Abuse/Addiction Treatment   [facility] Organization         Address  Phone  Notes  [facility]  [phone]   Domenic Schwab, PhD [address] Arlis Porta [place], [place]   [phone] or [phone]   [place], [place] [phone]     [facility] [address] [place], [place] [phone] Insurance/Medicaid/sponsorship through [facility] and Families [address]                                    [place], [place] [phone] [place] [address], [place] [phone]    Dr. Adele Schilder  [phone]   [facility] Dept. 1) [address], [place] 2) [place] 3)  [place] [address], [place] [phone]  [phone]   [place] [phone] or [phone] (After Hours)

## 2015-06-23 NOTE — ED Notes (Signed)
Bed: YQ65 Expected date:  Expected time:  Means of arrival:  Comments: EMS 29yo lower back pain since 9pm

## 2016-07-23 ENCOUNTER — Emergency Department (HOSPITAL_COMMUNITY)
Admission: EM | Admit: 2016-07-23 | Discharge: 2016-07-23 | Disposition: A | Discharge status: Home / Self Care | Payer: Self-pay | Attending: Emergency Medicine | Admitting: Emergency Medicine

## 2016-07-23 ENCOUNTER — Encounter (HOSPITAL_COMMUNITY): Payer: Self-pay | Admitting: Nurse Practitioner

## 2016-07-23 DIAGNOSIS — T6391XA Toxic effect of contact with unspecified venomous animal, accidental (unintentional), initial encounter: Secondary | ICD-10-CM | POA: Insufficient documentation

## 2016-07-23 DIAGNOSIS — T63481A Toxic effect of venom of other arthropod, accidental (unintentional), initial encounter: Secondary | ICD-10-CM

## 2016-07-23 DIAGNOSIS — Z79899 Other long term (current) drug therapy: Secondary | ICD-10-CM | POA: Insufficient documentation

## 2016-07-23 DIAGNOSIS — Z87891 Personal history of nicotine dependence: Secondary | ICD-10-CM | POA: Insufficient documentation

## 2016-07-23 MED ORDER — LORATADINE 10 MG PO TABS
10.0000 mg | ORAL_TABLET | Freq: Once | ORAL | Status: AC
  Administered 2016-07-23: 10 mg via ORAL
  Filled 2016-07-23: qty 1

## 2016-07-23 MED ORDER — PREDNISONE 20 MG PO TABS
40.0000 mg | ORAL_TABLET | Freq: Once | ORAL | Status: AC
  Administered 2016-07-23: 40 mg via ORAL
  Filled 2016-07-23: qty 2

## 2016-07-23 MED ORDER — FAMOTIDINE 20 MG PO TABS: 20.0000 mg | ORAL_TABLET | Freq: Two times a day (BID) | ORAL | 0 refills | Status: DC

## 2016-07-23 MED ORDER — PREDNISONE 10 MG PO TABS: 20.0000 mg | ORAL_TABLET | Freq: Two times a day (BID) | ORAL | 0 refills | Status: DC

## 2016-07-23 MED ORDER — FAMOTIDINE 20 MG PO TABS
20.0000 mg | ORAL_TABLET | Freq: Once | ORAL | Status: AC
  Administered 2016-07-23: 20 mg via ORAL
  Filled 2016-07-23: qty 1

## 2016-07-23 NOTE — ED Notes (Signed)
Patient left at this time with all belongings. 

## 2016-07-23 NOTE — Discharge Instructions (Signed)
You may take Benadryl in addition to the medications we give you. The benadryl can make you sleepy so try it at night at first.

## 2016-07-23 NOTE — ED Notes (Signed)
ED Provider at bedside. 

## 2016-07-23 NOTE — ED Provider Notes (Signed)
[facility]EM[facility]ENC[facility] DEPT Provider Note   CSN: [id number] Arrival date & time: 07/23/16  1858  By signing my name below, I, [name] [name], attest that this documentation has been prepared under the direction and in the presence of [facility] [place]. Electronically Signed: Clarisse GougeXavier [name], Scribe. 07/23/16. 10:59 PM.    History   Chief Complaint Chief Complaint  Patient presents with  . Skin Problem    The history is provided by the patient. No language interpreter was used.   HPI Comments: [name] [name] is a 30 y.o. male who presents to the Emergency Department complaining of sudden onset, gradually improving, constant swelling above his right eyebrow beginning earlier today. Pt states that he was taking a break at work when he noticed swelling above his right eyebrow accompanied by two very small holes, and he believes that something may have bit him. He further reports associated upper right eyebrow pain and pressure, and burning pain to the affected area. Pt denies visual disturbances, minimal redness and itching.   History reviewed. No pertinent past medical history.  There are no active problems to display for this patient.   History reviewed. No pertinent surgical history.     Home Medications    Prior to Admission medications   Medication Sig Start Date End Date Taking? Authorizing Provider  diphenhydrAMINE (BENADRYL) 25 MG tablet Take 25 mg by mouth every 6 (six) hours as needed for itching or allergies.    Historical Provider, MD  famotidine (PEPCID) 20 MG tablet Take 1 tablet (20 mg total) by mouth 2 (two) times daily. 07/23/16   Dian Laprade Orlene OchM Daelin Haste, NP  ibuprofen (ADVIL,MOTRIN) 800 MG tablet Take 1 tablet (800 mg total) by mouth 3 (three) times daily. 06/23/15   Garlon HatchetLisa M Sanders, PA-C  methocarbamol (ROBAXIN) 500 MG tablet Take 1 tablet (500 mg total) by mouth 2 (two) times daily. 06/23/15   Garlon HatchetLisa M Sanders, PA-C  ondansetron (ZOFRAN) 4 MG tablet Take 1 tablet (4 mg total) by mouth every 6  (six) hours. Patient not taking: Reported on 08/31/2014 06/17/12   Sunnie NielsenBrian Opitz, MD  predniSONE (DELTASONE) 10 MG tablet Take 2 tablets (20 mg total) by mouth 2 (two) times daily with a meal. 07/23/16   Braulio Kiedrowski Orlene OchM Keiran Gaffey, NP  sulfamethoxazole-trimethoprim (BACTRIM DS,SEPTRA DS) 800-160 MG per tablet Take 1 tablet by mouth 2 (two) times daily. Patient not taking: Reported on 06/23/2015 08/31/14   Gerhard Munchobert Lockwood, MD    Family History History reviewed. No pertinent family history.  Social History Social History  Substance Use Topics  . Smoking status: Former Smoker    Types: Cigars  . Smokeless tobacco: Never Used  . Alcohol use No     Allergies   Tomato   Review of Systems Review of Systems  Eyes: Negative for redness, itching and visual disturbance.  Respiratory: Negative for shortness of breath.   Skin: Positive for color change.       Red raised area just above right eyebrow.  All other systems reviewed and are negative.    Physical Exam Updated Vital Signs BP 118/72 (BP Location: Right Arm)   Pulse 64   Temp 98.2 F (36.8 C) (Oral)   Resp 20   Ht 5\' 7"  (1.702 m)   Wt 69.9 kg   SpO2 99%   BMI 24.12 kg/m   Physical Exam  Constitutional: He is oriented to person, place, and time. He appears well-developed and well-nourished.  HENT:  Head: Normocephalic and atraumatic.    Right Ear: Tympanic  membrane normal.  Left Ear: Tympanic membrane normal.  Mouth/Throat: Uvula is midline. No posterior oropharyngeal edema or posterior oropharyngeal erythema.  Eyes: Conjunctivae and EOM are normal. Pupils are equal, round, and reactive to light.  Neck: Normal range of motion. Neck supple.  Cardiovascular: Normal rate and regular rhythm.   Pulmonary/Chest: Effort normal and breath sounds normal. No respiratory distress.  Musculoskeletal: Normal range of motion.  Lymphadenopathy:    He has no cervical adenopathy.  Neurological: He is alert and oriented to person, place, and  time.  Skin: Skin is warm and dry. There is erythema.  Psychiatric: He has a normal mood and affect. Judgment normal.  Nursing note and vitals reviewed.    ED Treatments / Results  DIAGNOSTIC STUDIES: Oxygen Saturation is 100% on RA, normal by my interpretation.    COORDINATION OF CARE: 10:59 PM Will order medications. Discussed treatment plan with pt at bedside and pt agreed to plan.   Labs (all labs ordered are listed, but only abnormal results are displayed) Labs Reviewed - No data to display  Radiology No results found.  Procedures Procedures (including critical care time)  Medications Ordered in ED Medications  loratadine (CLARITIN) tablet 10 mg (10 mg Oral Given 07/23/16 2004)  famotidine (PEPCID) tablet 20 mg (20 mg Oral Given 07/23/16 2004)  predniSONE (DELTASONE) tablet 40 mg (40 mg Oral Given 07/23/16 2004)     Initial Impression / Assessment and Plan / ED Course  I have reviewed the triage vital signs and the nursing notes.  Pertinent labs & imaging results that were available during my care of the patient were reviewed by me and considered in my medical decision making (see chart for details).  Clinical Course   30 y.o. male with local reaction to insect bite stable for d/c without respiratory symptoms and O2 SAT 100% on R/A. Will treat for with short course of prednisone and Pepcid. Patient will take Benadryl as needed.   Final Clinical Impressions(s) / ED Diagnoses   Final diagnoses:  Local reaction to insect sting, accidental or unintentional, initial encounter    New Prescriptions Discharge Medication List as of 07/23/2016  9:03 PM    START taking these medications   Details  famotidine (PEPCID) 20 MG tablet Take 1 tablet (20 mg total) by mouth 2 (two) times daily., Starting Sat 07/23/2016, Print    predniSONE (DELTASONE) 10 MG tablet Take 2 tablets (20 mg total) by mouth 2 (two) times daily with a meal., Starting Sat 07/23/2016, Print      I  personally performed the services described in this documentation, which was scribed in my presence. The recorded information has been reviewed and is accurate.    [address]Jammy Stlouis [place] Denk, [place]NP 07/23/16 [id number]    Pricilla LovelessScott [place]ston, MD 07/23/16 [phone]

## 2016-07-23 NOTE — ED Triage Notes (Signed)
Pt presents with c/o skin problem. He noticed an itchy painful bump above R eyebrow this afternoon. He applied ice with some relief but he is concerned that something may have bitten him.

## 2016-08-06 ENCOUNTER — Emergency Department (HOSPITAL_COMMUNITY)
Admission: EM | Admit: 2016-08-06 | Discharge: 2016-08-07 | Disposition: A | Discharge status: Home / Self Care | Payer: Medicaid Other | Attending: Emergency Medicine | Admitting: Emergency Medicine

## 2016-08-06 ENCOUNTER — Encounter (HOSPITAL_COMMUNITY): Payer: Self-pay | Admitting: Emergency Medicine

## 2016-08-06 DIAGNOSIS — Y999 Unspecified external cause status: Secondary | ICD-10-CM | POA: Insufficient documentation

## 2016-08-06 DIAGNOSIS — S61206A Unspecified open wound of right little finger without damage to nail, initial encounter: Secondary | ICD-10-CM | POA: Insufficient documentation

## 2016-08-06 DIAGNOSIS — Y929 Unspecified place or not applicable: Secondary | ICD-10-CM | POA: Insufficient documentation

## 2016-08-06 DIAGNOSIS — F332 Major depressive disorder, recurrent severe without psychotic features: Secondary | ICD-10-CM | POA: Diagnosis present

## 2016-08-06 DIAGNOSIS — S61209A Unspecified open wound of unspecified finger without damage to nail, initial encounter: Secondary | ICD-10-CM

## 2016-08-06 DIAGNOSIS — Z87891 Personal history of nicotine dependence: Secondary | ICD-10-CM | POA: Insufficient documentation

## 2016-08-06 DIAGNOSIS — R45851 Suicidal ideations: Secondary | ICD-10-CM | POA: Insufficient documentation

## 2016-08-06 DIAGNOSIS — Y9389 Activity, other specified: Secondary | ICD-10-CM | POA: Insufficient documentation

## 2016-08-06 DIAGNOSIS — Z23 Encounter for immunization: Secondary | ICD-10-CM | POA: Insufficient documentation

## 2016-08-06 DIAGNOSIS — Z79899 Other long term (current) drug therapy: Secondary | ICD-10-CM | POA: Insufficient documentation

## 2016-08-06 DIAGNOSIS — W228XXA Striking against or struck by other objects, initial encounter: Secondary | ICD-10-CM | POA: Insufficient documentation

## 2016-08-06 LAB — COMPREHENSIVE METABOLIC PANEL
ALBUMIN: 4.1 g/dL (ref 3.5–5.0)
ALT: 24 U/L (ref 17–63)
AST: 25 U/L (ref 15–41)
Alkaline Phosphatase: 38 U/L (ref 38–126)
Anion gap: 8 (ref 5–15)
BUN: 8 mg/dL (ref 6–20)
CHLORIDE: 107 mmol/L (ref 101–111)
CO2: 25 mmol/L (ref 22–32)
Calcium: 9.1 mg/dL (ref 8.9–10.3)
Creatinine, Ser: 0.96 mg/dL (ref 0.61–1.24)
GFR calc Af Amer: >60 mL/min (ref >60)
GLUCOSE: 85 mg/dL (ref 65–99)
POTASSIUM: 3.6 mmol/L (ref 3.5–5.1)
SODIUM: 140 mmol/L (ref 135–145)
Total Bilirubin: 0.3 mg/dL (ref 0.3–1.2)
Total Protein: 6.8 g/dL (ref 6.5–8.1)

## 2016-08-06 LAB — CBC
HCT: 38.1 % — ABNORMAL LOW (ref 39.0–52.0)
Hemoglobin: 13.6 g/dL (ref 13.0–17.0)
MCH: 33.8 pg (ref 26.0–34.0)
MCHC: 35.7 g/dL (ref 30.0–36.0)
MCV: 94.8 fL (ref 78.0–100.0)
PLATELETS: 208 10*3/uL (ref 150–400)
RBC: 4.02 MIL/uL — AB (ref 4.22–5.81)
RDW: 13.2 % (ref 11.5–15.5)
WBC: 5.7 10*3/uL (ref 4.0–10.5)

## 2016-08-06 LAB — SALICYLATE LEVEL: Salicylate Lvl: <7 mg/dL (ref 2.8–30.0)

## 2016-08-06 LAB — ETHANOL

## 2016-08-06 LAB — ACETAMINOPHEN LEVEL: Acetaminophen (Tylenol), Serum: <10 ug/mL — ABNORMAL LOW (ref 10–30)

## 2016-08-06 MED ORDER — TETANUS-DIPHTH-ACELL PERTUSSIS 5-2.5-18.5 LF-MCG/0.5 IM SUSP
0.5000 mL | Freq: Once | INTRAMUSCULAR | Status: DC
  Filled 2016-08-06: qty 0.5
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
## 2016-08-06 NOTE — ED Notes (Signed)
Pt is wanting to leave and stating he is not suicidal now and was no,but told the nurse who triaged that he would not answer the question.  Pt has 2 children with him and wants to leave.  Dr. Preston FleetingGlick to bedside and he spoke with him and he does not want patient to leave and will take out emergency papers if needed
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
## 2016-08-07 DIAGNOSIS — Z79899 Other long term (current) drug therapy: Secondary | ICD-10-CM | POA: Diagnosis not present

## 2016-08-07 DIAGNOSIS — Z888 Allergy status to other drugs, medicaments and biological substances status: Secondary | ICD-10-CM | POA: Diagnosis not present

## 2016-08-07 DIAGNOSIS — F332 Major depressive disorder, recurrent severe without psychotic features: Secondary | ICD-10-CM | POA: Diagnosis not present

## 2016-08-07 LAB — RAPID URINE DRUG SCREEN, HOSP PERFORMED
AMPHETAMINES: NOT DETECTED
BENZODIAZEPINES: NOT DETECTED
Barbiturates: NOT DETECTED
COCAINE: NOT DETECTED
OPIATES: NOT DETECTED
TETRAHYDROCANNABINOL: POSITIVE — AB

## 2016-08-07 MED ORDER — LORAZEPAM 1 MG PO TABS
1.0000 mg | ORAL_TABLET | Freq: Three times a day (TID) | ORAL | Status: DC | PRN
  Filled 2016-08-07: qty 1

## 2016-08-07 MED ORDER — ZOLPIDEM TARTRATE 5 MG PO TABS: 5.0000 mg | ORAL_TABLET | Freq: Every evening | ORAL | Status: DC | PRN

## 2016-08-07 MED ORDER — IBUPROFEN 200 MG PO TABS: 600.0000 mg | ORAL_TABLET | Freq: Three times a day (TID) | ORAL | Status: DC | PRN

## 2016-08-07 MED ORDER — TETANUS-DIPHTH-ACELL PERTUSSIS 5-2.5-18.5 LF-MCG/0.5 IM SUSP
0.5000 mL | Freq: Once | INTRAMUSCULAR | Status: AC
Start: 2016-08-07 — End: 2016-08-07
  Administered 2016-08-07: 0.5 mL via INTRAMUSCULAR
  Filled 2016-08-07: qty 0.5

## 2016-08-07 MED ORDER — ALUM & MAG HYDROXIDE-SIMETH 200-200-20 MG/5ML PO SUSP: 30.0000 mL | ORAL | Status: DC | PRN

## 2016-08-07 MED ORDER — ACETAMINOPHEN 325 MG PO TABS
650.0000 mg | ORAL_TABLET | ORAL | Status: DC | PRN
Start: 2016-08-07 — End: 2016-08-07

## 2016-08-07 MED ORDER — ONDANSETRON HCL 4 MG PO TABS: 4.0000 mg | ORAL_TABLET | Freq: Three times a day (TID) | ORAL | Status: DC | PRN

## 2016-08-07 NOTE — ED Notes (Signed)
Resources for therapy/counseling/psychiatry discussed and given to pt.

## 2016-08-07 NOTE — ED Notes (Signed)
Pt sitting in chair in room. States he feels ready to be d/c'd. Pt able to ambulate w/o difficulty. Pt alert, oriented. States he feels fine to drive himself from ED.

## 2016-08-07 NOTE — ED Notes (Addendum)
Pt at nurse station yelling, pt states that he needs to "secure his job and his kids" pt told he was able to make one phone call to call out of work for the AM as long as he stops yelling and cooperates with staff. Pt agrees, pt states that he is going to refuse to stay in his room cause he is not good with new places and he suffers from severe anxiety. Pt states that he also suffers from insomnia and that he will refuse to take anything for sleep. Pt excessively loud and agitated. Security at bedside. Pt told he could sit in the doorway of room in a chair, only if he was quiet and cooperative, pt in agrees.

## 2016-08-07 NOTE — ED Notes (Signed)
Pt continues to sit in chair in doorway of room. Advised pt Telepsych will be performed this am, per [facility].

## 2016-08-07 NOTE — ED Notes (Signed)
Belonging taken and placed at the desk.  Wanded by security.  Children taken from room and taken to mother.
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
## 2016-08-07 NOTE — ED Notes (Signed)
IVC paperwork rescinded by Dr Ethelda ChickJacubowitz - [facility] copy to [facility] records - original placed in folder for [employer].

## 2016-08-07 NOTE — ED Notes (Signed)
Pt speaking with gf on the phone in his room. Pt calm at this moment.

## 2016-08-07 NOTE — ED Notes (Signed)
Lunch delivered - declined.
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
## 2016-08-07 NOTE — ED Notes (Signed)
Pt had c/o feeling anxious this am - refused to take Ativan offered x 3. Pt sat in chair in hallway after telepsych was performed. C/o feeling anxious - cold cloth applied to pt's neck. Pt refused Ativan offered to assist w/his "panic attack". States he will "just sit here and breathe". This RN was in w/another pt, when sitter advised pt had lowered himself to the floor. RN noted pt lying on floor on abd w/head turned to left side. Pt advised RN - "I just wanted to lay down on the cold floor. It makes me feel better". Denied any c/o pain and denied falling. Pt stated to himself "I'm going to be all right - just breathe". VSS. Pt refused assistance offered to get up from floor. Morrison OldMelissa Scruggs, Consulting civil engineerCharge RN, aware.Dr Ethelda ChickJacubowitz aware and assessed pt. After approx 15-20 minutes, pt allowed [place], EMT, to assist him to lie on stretcher in room. Pt stated to RN - "I fought off that panic attack all morning. I feel better. I'm glad it finally came out". Pt remains alert, oriented. Pt drank water x 2 cups w/o difficulty. Offered pt food - declined.

## 2016-08-07 NOTE — ED Notes (Signed)
Pt sitting in chair in doorway of room quietly.

## 2016-08-07 NOTE — ED Notes (Signed)
Pt constantly asking about talking with the dr and his wife about his children. Pt continuously told by RN to stay in room and keep quiet. Police on stand by.

## 2016-08-07 NOTE — Discharge Instructions (Signed)
Wash the wound on your hand daily with soap and water and place a thin layer of bacitracin ointment over the wound and cover with a sterile bandage. Signs of infection include more pain, drainage from the room, redness from the wound or fever. Return or see an urgent care center if you think he might be developing an infection. Call any of the numbers that you provided to get help with anger management. If you think you may want to harm yourself or someone else call 911 immediately

## 2016-08-07 NOTE — ED Notes (Signed)
Unhappy with breakfast tray ,called and ordered what patient wanted. . Refuses to lay on stretcher, sitting in chair at doorway.

## 2016-08-07 NOTE — ED Notes (Signed)
Faxed IVC paperwork to [facility] @ 1205

## 2016-08-07 NOTE — ED Notes (Signed)
Pt sitting in doorway of room talking to sitter. Pt noted to be talkative. Pt has 2 labeled belongings bags at nurses' desk.

## 2016-08-07 NOTE — ED Notes (Signed)
Dr Ethelda ChickJacubowitz discussed Tdap w/pt - pt states had previously refused and will now take.

## 2016-08-07 NOTE — ED Provider Notes (Addendum)
Sitting in chair, talkative, alert. Denies complaint. pleasant cooperative no distress. Results for orders placed or performed during the hospital encounter of 08/06/16  Comprehensive metabolic panel  Result Value Ref Range   Sodium 140 135 - 145 mmol/L   Potassium 3.6 3.5 - 5.1 mmol/L   Chloride 107 101 - 111 mmol/L   CO2 25 22 - 32 mmol/L   Glucose, Bld 85 65 - 99 mg/dL   BUN 8 6 - 20 mg/dL   Creatinine, Ser 1.470.96 0.61 - 1.24 mg/dL   Calcium 9.1 8.9 - 82.910.3 mg/dL   Total Protein 6.8 6.5 - 8.1 g/dL   Albumin 4.1 3.5 - 5.0 g/dL   AST 25 15 - 41 U/L   ALT 24 17 - 63 U/L   Alkaline Phosphatase 38 38 - 126 U/L   Total Bilirubin 0.3 0.3 - 1.2 mg/dL   GFR calc non Af Amer >60 >60 mL/min   GFR calc Af Amer >60 >60 mL/min   Anion gap 8 5 - 15  Ethanol  Result Value Ref Range   Alcohol, Ethyl (B) <5 <5 mg/dL  Salicylate level  Result Value Ref Range   Salicylate Lvl <7.0 2.8 - 30.0 mg/dL  Acetaminophen level  Result Value Ref Range   Acetaminophen (Tylenol), Serum <10 (L) 10 - 30 ug/mL  cbc  Result Value Ref Range   WBC 5.7 4.0 - 10.5 K/uL   RBC 4.02 (L) 4.22 - 5.81 MIL/uL   Hemoglobin 13.6 13.0 - 17.0 g/dL   HCT 56.238.1 (L) 13.039.0 - 86.552.0 %   MCV 94.8 78.0 - 100.0 fL   MCH 33.8 26.0 - 34.0 pg   MCHC 35.7 30.0 - 36.0 g/dL   RDW 78.413.2 69.611.5 - 29.515.5 %   Platelets 208 150 - 400 K/uL  Rapid urine drug screen (hospital performed)  Result Value Ref Range   Opiates NONE DETECTED NONE DETECTED   Cocaine NONE DETECTED NONE DETECTED   Benzodiazepines NONE DETECTED NONE DETECTED   Amphetamines NONE DETECTED NONE DETECTED   Tetrahydrocannabinol POSITIVE (A) NONE DETECTED   Barbiturates NONE DETECTED NONE DETECTED   No results found.   Doug SouSam Katrina Brosh, MD 08/07/16 0901 10:15 AM patient laid on the floor, having a panic attack. He was spoken to a gentle fashion and gently calm down. At 10:30 PM he is sitting in bed. Appears more calm   Doug SouSam Liandra Mendia, MD 08/07/16 1029 11:55 AM patient is calm  and alert no distress and denies complaint. He feels ready for discharge. IVC papers have been rescinded by me. He'll be referred for anger management. Right hand examined. There is a  2-3 mm laceration over dorsal aspect of fifth finger proximal phalanx without tenderness surrounding redness or swelling or drainage. No signs of infection. He has full range of motion. Wound will be dressed prior to discharge   Doug SouSam Amariya Liskey, MD 08/07/16 1157    Doug SouSam Fidencia Mccloud, MD 08/07/16 1203

## 2016-08-07 NOTE — ED Notes (Signed)
Sitting in chair in doorway of room talking w/sitter.

## 2016-08-23 ENCOUNTER — Emergency Department (HOSPITAL_COMMUNITY)
Admission: EM | Admit: 2016-08-23 | Discharge: 2016-08-23 | Disposition: A | Discharge status: Home / Self Care | Payer: Self-pay | Attending: Emergency Medicine | Admitting: Emergency Medicine

## 2016-08-23 ENCOUNTER — Encounter (HOSPITAL_COMMUNITY): Payer: Self-pay | Admitting: *Deleted

## 2016-08-23 ENCOUNTER — Emergency Department (HOSPITAL_COMMUNITY): Payer: Self-pay

## 2016-08-23 DIAGNOSIS — E860 Dehydration: Secondary | ICD-10-CM

## 2016-08-23 DIAGNOSIS — Z87891 Personal history of nicotine dependence: Secondary | ICD-10-CM | POA: Insufficient documentation

## 2016-08-23 DIAGNOSIS — R51 Headache: Secondary | ICD-10-CM | POA: Insufficient documentation

## 2016-08-23 DIAGNOSIS — R519 Headache, unspecified: Secondary | ICD-10-CM

## 2016-08-23 LAB — BASIC METABOLIC PANEL
ANION GAP: 8 (ref 5–15)
BUN: 9 mg/dL (ref 6–20)
CALCIUM: 8.7 mg/dL — AB (ref 8.9–10.3)
CO2: 25 mmol/L (ref 22–32)
Chloride: 105 mmol/L (ref 101–111)
Creatinine, Ser: 1.13 mg/dL (ref 0.61–1.24)
GFR calc Af Amer: >60 mL/min (ref >60)
GLUCOSE: 97 mg/dL (ref 65–99)
Potassium: 4.3 mmol/L (ref 3.5–5.1)
SODIUM: 138 mmol/L (ref 135–145)

## 2016-08-23 LAB — COOXEMETRY PANEL
Carboxyhemoglobin: 1.8 % — ABNORMAL HIGH (ref 0.5–1.5)
Methemoglobin: 0.9 % (ref 0.0–1.5)
O2 Saturation: 60.4 %
TOTAL HEMOGLOBIN: 14 g/dL (ref 12.0–16.0)

## 2016-08-23 LAB — CBC WITH DIFFERENTIAL/PLATELET
BASOS ABS: 0 10*3/uL (ref 0.0–0.1)
BASOS PCT: 0 %
EOS ABS: 0 10*3/uL (ref 0.0–0.7)
EOS PCT: 0 %
HCT: 39.1 % (ref 39.0–52.0)
Hemoglobin: 13.7 g/dL (ref 13.0–17.0)
Lymphocytes Relative: 39 %
Lymphs Abs: 1.3 10*3/uL (ref 0.7–4.0)
MCH: 33.1 pg (ref 26.0–34.0)
MCHC: 35 g/dL (ref 30.0–36.0)
MCV: 94.4 fL (ref 78.0–100.0)
MONO ABS: 0.2 10*3/uL (ref 0.1–1.0)
Monocytes Relative: 5 %
Neutro Abs: 1.8 10*3/uL (ref 1.7–7.7)
Neutrophils Relative %: 56 %
PLATELETS: 132 10*3/uL — AB (ref 150–400)
RBC: 4.14 MIL/uL — AB (ref 4.22–5.81)
RDW: 12.9 % (ref 11.5–15.5)
WBC: 3.4 10*3/uL — AB (ref 4.0–10.5)

## 2016-08-23 LAB — I-STAT ARTERIAL BLOOD GAS, ED
ACID-BASE DEFICIT: 2 mmol/L (ref 0.0–2.0)
Bicarbonate: 22 mmol/L (ref 20.0–28.0)
O2 SAT: 95 %
PH ART: 7.417 (ref 7.350–7.450)
PO2 ART: 75 mmHg — AB (ref 83.0–108.0)
TCO2: 23 mmol/L (ref 0–100)
pCO2 arterial: 34.1 mmHg (ref 32.0–48.0)

## 2016-08-23 LAB — INFLUENZA PANEL BY PCR (TYPE A & B)
INFLAPCR: NEGATIVE
Influenza B By PCR: NEGATIVE

## 2016-08-23 MED ORDER — METHYLPREDNISOLONE SODIUM SUCC 125 MG IJ SOLR
125.0000 mg | Freq: Once | INTRAMUSCULAR | Status: AC
  Administered 2016-08-23: 125 mg via INTRAVENOUS
  Filled 2016-08-23: qty 2

## 2016-08-23 MED ORDER — DIPHENHYDRAMINE HCL 50 MG/ML IJ SOLN
25.0000 mg | Freq: Once | INTRAMUSCULAR | Status: AC
  Administered 2016-08-23: 25 mg via INTRAVENOUS
  Filled 2016-08-23: qty 1

## 2016-08-23 MED ORDER — KETOROLAC TROMETHAMINE 30 MG/ML IJ SOLN
30.0000 mg | Freq: Once | INTRAMUSCULAR | Status: AC
  Administered 2016-08-23: 30 mg via INTRAVENOUS
  Filled 2016-08-23: qty 1

## 2016-08-23 MED ORDER — METOCLOPRAMIDE HCL 5 MG/ML IJ SOLN
10.0000 mg | Freq: Once | INTRAMUSCULAR | Status: AC
  Administered 2016-08-23: 10 mg via INTRAVENOUS
  Filled 2016-08-23: qty 2

## 2016-08-23 MED ORDER — SODIUM CHLORIDE 0.9 % IV BOLUS (SEPSIS)
1000.0000 mL | Freq: Once | INTRAVENOUS | Status: AC
  Administered 2016-08-23: 1000 mL via INTRAVENOUS
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
## 2016-08-23 NOTE — ED Notes (Signed)
Patient transported to CT 
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
## 2017-04-28 ENCOUNTER — Inpatient Hospital Stay (HOSPITAL_COMMUNITY)
Admission: EM | Admit: 2017-04-28 | Discharge: 2017-04-29 | DRG: 394 | Disposition: A | Discharge status: Home / Self Care | Payer: Self-pay | Attending: Internal Medicine | Admitting: Internal Medicine

## 2017-04-28 ENCOUNTER — Encounter (HOSPITAL_COMMUNITY): Payer: Self-pay | Admitting: Certified Registered"

## 2017-04-28 ENCOUNTER — Encounter (HOSPITAL_COMMUNITY): Payer: Self-pay | Admitting: *Deleted

## 2017-04-28 ENCOUNTER — Emergency Department (HOSPITAL_COMMUNITY): Payer: Self-pay

## 2017-04-28 ENCOUNTER — Inpatient Hospital Stay (HOSPITAL_COMMUNITY): Payer: Self-pay

## 2017-04-28 DIAGNOSIS — K922 Gastrointestinal hemorrhage, unspecified: Secondary | ICD-10-CM

## 2017-04-28 DIAGNOSIS — Z87891 Personal history of nicotine dependence: Secondary | ICD-10-CM

## 2017-04-28 DIAGNOSIS — R1030 Lower abdominal pain, unspecified: Secondary | ICD-10-CM

## 2017-04-28 DIAGNOSIS — R634 Abnormal weight loss: Secondary | ICD-10-CM | POA: Diagnosis present

## 2017-04-28 DIAGNOSIS — F332 Major depressive disorder, recurrent severe without psychotic features: Secondary | ICD-10-CM | POA: Diagnosis present

## 2017-04-28 DIAGNOSIS — Z91018 Allergy to other foods: Secondary | ICD-10-CM

## 2017-04-28 DIAGNOSIS — J40 Bronchitis, not specified as acute or chronic: Secondary | ICD-10-CM | POA: Diagnosis present

## 2017-04-28 DIAGNOSIS — K921 Melena: Secondary | ICD-10-CM | POA: Diagnosis present

## 2017-04-28 DIAGNOSIS — F1290 Cannabis use, unspecified, uncomplicated: Secondary | ICD-10-CM | POA: Diagnosis present

## 2017-04-28 DIAGNOSIS — D649 Anemia, unspecified: Secondary | ICD-10-CM

## 2017-04-28 DIAGNOSIS — K648 Other hemorrhoids: Principal | ICD-10-CM | POA: Diagnosis present

## 2017-04-28 DIAGNOSIS — D500 Iron deficiency anemia secondary to blood loss (chronic): Secondary | ICD-10-CM | POA: Diagnosis present

## 2017-04-28 LAB — CBC WITH DIFFERENTIAL/PLATELET
BASOS ABS: 0 10*3/uL (ref 0.0–0.1)
BASOS PCT: 0 %
Basophils Absolute: 0 10*3/uL (ref 0.0–0.1)
Basophils Relative: 0 %
EOS ABS: 0.1 10*3/uL (ref 0.0–0.7)
EOS ABS: 0.1 10*3/uL (ref 0.0–0.7)
Eosinophils Relative: 4 %
Eosinophils Relative: 3 %
HCT: 30.7 % — ABNORMAL LOW (ref 39.0–52.0)
HEMATOCRIT: 54.1 % — AB (ref 39.0–52.0)
HEMOGLOBIN: 19.7 g/dL — AB (ref 13.0–17.0)
Hemoglobin: 10.8 g/dL — ABNORMAL LOW (ref 13.0–17.0)
LYMPHS ABS: 0.7 10*3/uL (ref 0.7–4.0)
LYMPHS ABS: 1.5 10*3/uL (ref 0.7–4.0)
LYMPHS PCT: 24 %
Lymphocytes Relative: 41 %
MCH: 34.1 pg — AB (ref 26.0–34.0)
MCH: 33.2 pg (ref 26.0–34.0)
MCHC: 36.4 g/dL — AB (ref 30.0–36.0)
MCHC: 35.2 g/dL (ref 30.0–36.0)
MCV: 93.8 fL (ref 78.0–100.0)
MCV: 94.5 fL (ref 78.0–100.0)
MONOS PCT: 8 %
MONOS PCT: 11 %
Monocytes Absolute: 0.2 10*3/uL (ref 0.1–1.0)
Monocytes Absolute: 0.4 10*3/uL (ref 0.1–1.0)
NEUTROS ABS: 1.7 10*3/uL (ref 1.7–7.7)
NEUTROS PCT: 63 %
NEUTROS PCT: 45 %
Neutro Abs: 1.7 10*3/uL (ref 1.7–7.7)
PLATELETS: 167 10*3/uL (ref 150–400)
Platelets: 50 10*3/uL — ABNORMAL LOW (ref 150–400)
RBC: 5.77 MIL/uL (ref 4.22–5.81)
RBC: 3.25 MIL/uL — AB (ref 4.22–5.81)
RDW: 13.8 % (ref 11.5–15.5)
RDW: 13.8 % (ref 11.5–15.5)
WBC: 2.7 10*3/uL — ABNORMAL LOW (ref 4.0–10.5)
WBC: 3.7 10*3/uL — AB (ref 4.0–10.5)

## 2017-04-28 LAB — COMPREHENSIVE METABOLIC PANEL
ALBUMIN: 3.5 g/dL (ref 3.5–5.0)
ALK PHOS: 41 U/L (ref 38–126)
ALT: 28 U/L (ref 17–63)
ANION GAP: 9 (ref 5–15)
AST: 27 U/L (ref 15–41)
BUN: 8 mg/dL (ref 6–20)
CALCIUM: 8.6 mg/dL — AB (ref 8.9–10.3)
CHLORIDE: 110 mmol/L (ref 101–111)
CO2: 21 mmol/L — AB (ref 22–32)
Creatinine, Ser: 0.81 mg/dL (ref 0.61–1.24)
GFR calc Af Amer: >60 mL/min (ref >60)
GFR calc non Af Amer: >60 mL/min (ref >60)
GLUCOSE: 93 mg/dL (ref 65–99)
Potassium: 4 mmol/L (ref 3.5–5.1)
SODIUM: 140 mmol/L (ref 135–145)
Total Bilirubin: 0.5 mg/dL (ref 0.3–1.2)
Total Protein: 5.9 g/dL — ABNORMAL LOW (ref 6.5–8.1)

## 2017-04-28 LAB — RAPID URINE DRUG SCREEN, HOSP PERFORMED
Amphetamines: NOT DETECTED
BARBITURATES: NOT DETECTED
Benzodiazepines: NOT DETECTED
COCAINE: NOT DETECTED
OPIATES: NOT DETECTED
TETRAHYDROCANNABINOL: POSITIVE — AB

## 2017-04-28 LAB — URINALYSIS, ROUTINE W REFLEX MICROSCOPIC
BILIRUBIN URINE: NEGATIVE
Glucose, UA: NEGATIVE mg/dL
HGB URINE DIPSTICK: NEGATIVE
KETONES UR: NEGATIVE mg/dL
NITRITE: NEGATIVE
Protein, ur: NEGATIVE mg/dL
Specific Gravity, Urine: 1.011 (ref 1.005–1.030)
pH: 7 (ref 5.0–8.0)

## 2017-04-28 LAB — PROTIME-INR
INR: 0.96
PROTHROMBIN TIME: 12.8 s (ref 11.4–15.2)

## 2017-04-28 LAB — HEMOGLOBIN AND HEMATOCRIT, BLOOD
HCT: 31.5 % — ABNORMAL LOW (ref 39.0–52.0)
HEMOGLOBIN: 10.9 g/dL — AB (ref 13.0–17.0)

## 2017-04-28 LAB — C-REACTIVE PROTEIN: CRP: <0.8 mg/dL (ref <1.0)

## 2017-04-28 LAB — D-DIMER, QUANTITATIVE (NOT AT ARMC)

## 2017-04-28 LAB — APTT: APTT: 33 s (ref 24–36)

## 2017-04-28 LAB — LIPASE, BLOOD: Lipase: 31 U/L (ref 11–51)

## 2017-04-28 LAB — ETHANOL: Alcohol, Ethyl (B): <5 mg/dL (ref <5)

## 2017-04-28 LAB — POC OCCULT BLOOD, ED: Fecal Occult Bld: NEGATIVE

## 2017-04-28 LAB — D-DIMER, QUANTITATIVE: D-Dimer, Quant: <0.27 ug/mL-FEU (ref 0.00–0.50)

## 2017-04-28 LAB — SEDIMENTATION RATE: Sed Rate: 13 mm/hr (ref 0–16)

## 2017-04-28 MED ORDER — BUTALBITAL-APAP-CAFFEINE 50-325-40 MG PO TABS
2.0000 | ORAL_TABLET | Freq: Once | ORAL | Status: AC
  Administered 2017-04-28: 2 via ORAL
  Filled 2017-04-28: qty 2

## 2017-04-28 MED ORDER — METOCLOPRAMIDE HCL 5 MG/ML IJ SOLN
10.0000 mg | Freq: Once | INTRAMUSCULAR | Status: AC
  Administered 2017-04-28: 10 mg via INTRAVENOUS
  Filled 2017-04-28: qty 2

## 2017-04-28 MED ORDER — ONDANSETRON HCL 4 MG/2ML IJ SOLN: 4.0000 mg | Freq: Once | INTRAMUSCULAR | Status: DC

## 2017-04-28 MED ORDER — PANTOPRAZOLE SODIUM 40 MG PO TBEC
40.0000 mg | DELAYED_RELEASE_TABLET | Freq: Every day | ORAL | Status: DC
  Filled 2017-04-28: qty 1

## 2017-04-28 MED ORDER — IPRATROPIUM-ALBUTEROL 0.5-2.5 (3) MG/3ML IN SOLN
3.0000 mL | Freq: Once | RESPIRATORY_TRACT | Status: AC
  Administered 2017-04-28: 3 mL via RESPIRATORY_TRACT
  Filled 2017-04-28: qty 3

## 2017-04-28 MED ORDER — SODIUM CHLORIDE 0.9 % IV BOLUS (SEPSIS)
1000.0000 mL | Freq: Once | INTRAVENOUS | Status: AC
  Administered 2017-04-28: 1000 mL via INTRAVENOUS

## 2017-04-28 MED ORDER — METOCLOPRAMIDE HCL 5 MG/ML IJ SOLN
10.0000 mg | Freq: Once | INTRAMUSCULAR | Status: AC
  Administered 2017-04-29: 10 mg via INTRAVENOUS
  Filled 2017-04-28: qty 2

## 2017-04-28 MED ORDER — AZITHROMYCIN 500 MG IV SOLR
500.0000 mg | INTRAVENOUS | Status: DC
  Filled 2017-04-28: qty 500

## 2017-04-28 MED ORDER — POLYETHYLENE GLYCOL 3350 17 GM/SCOOP PO POWD
1.0000 | Freq: Once | ORAL | Status: AC
  Administered 2017-04-28: 255 g via ORAL
  Filled 2017-04-28: qty 255

## 2017-04-28 MED ORDER — IOPAMIDOL (ISOVUE-300) INJECTION 61%
INTRAVENOUS | Status: AC
  Administered 2017-04-28: 100 mL
  Filled 2017-04-28: qty 100

## 2017-04-28 NOTE — ED Triage Notes (Signed)
Pt is here with cough for 7 days, no sob.  Pt states rectal bleeding for 4 days.

## 2017-04-28 NOTE — H&P (Signed)
History and Physical    [name] [name] ZHY:[id number] DOB: March 07, 1986 DOA: 04/28/2017  PCP: Default, Provider, MD  Patient coming from: Home.   I have personally briefly reviewed patient's old medical records in [facility]  Chief Complaint: cough with productive sputum since one week and rectal bleed since 4 days.   HPI: [name] [name] is a 31 y.o. male with medical history significant of  Rectal bleed since one year, comes in for cough with productive sputum with occasional blood in one week, rectal bleed for 4 days. He reports crampy abdominal pain before he has a bowel movement. He denies any other complaints. No chest pain or sob,. No nausea, or vomiting. No abdominal pain currently.  No urinary symptoms. Pt reports chills and sweating at night and weight loss over the last few months. No fevers.  On arrival to ED labs were drawn, showed a hemoglobin of 10, when his baseline is around 13. CT abdomen pelvis was unremarkable. He was referred to medical service for admission. GI consulted and plan for colonoscopy in am.     Review of Systems: As per HPI otherwise 10 point review of systems negative.    History reviewed. No pertinent past medical history.  History reviewed. No pertinent surgical history.   reports that he has quit smoking. His smoking use included Cigars. He has never used smokeless tobacco. He reports that he uses drugs, including Marijuana. He reports that he does not drink alcohol.  Allergies  Allergen Reactions  . Tomato Anaphylaxis    Family history of heart disease present.   Prior to Admission medications   Not on File    Physical Exam: Vitals:   04/28/17 1230 04/28/17 1245 04/28/17 1248 04/28/17 1530  BP: 93/77 125/81  131/79  Pulse: (!) 56 (!) 57 62 65  Resp:   20   Temp:      TempSrc:      SpO2: 100% 100%  100%    Constitutional: NAD, calm, comfortable Vitals:   04/28/17 1230 04/28/17 1245 04/28/17 1248 04/28/17 1530  BP: 93/77 125/81   131/79  Pulse: (!) 56 (!) 57 62 65  Resp:   20   Temp:      TempSrc:      SpO2: 100% 100%  100%   Eyes: PERRL, lids and conjunctivae normal ENMT: Mucous membranes are moist. Posterior pharynx clear of any exudate or lesions.Normal dentition.  Neck: normal, supple, no masses, no thyromegaly Respiratory: clear to auscultation bilaterally, no wheezing, no crackles. Normal respiratory effort. No accessory muscle use.  Cardiovascular: Regular rate and rhythm, no murmurs / rubs / gallops. No extremity edema. 2+ pedal pulses. No carotid bruits.  Abdomen: no tenderness, no masses palpated. No hepatosplenomegaly. Bowel sounds positive.  Musculoskeletal: no clubbing / cyanosis. No joint deformity upper and lower extremities. Good ROM, no contractures. Normal muscle tone.  Skin: no rashes, lesions, ulcers. No induration Neurologic: CN 2-12 grossly intact. Sensation intact, DTR normal. Strength 5/5 in all 4.  Psychiatric: Normal judgment and insight. Alert and oriented x 3. Normal mood.     Labs on Admission: I have personally reviewed following labs and imaging studies  CBC:  Recent Labs Lab 04/28/17 1210 04/28/17 1347  WBC 2.7* 3.7*  NEUTROABS 1.7 1.7  HGB 19.7* 10.8*  HCT 54.1* 30.7*  MCV 93.8 94.5  PLT 50* 167   Basic Metabolic Panel:  Recent Labs Lab 04/28/17 1210  NA 140  K 4.0  CL 110  CO2 21*  GLUCOSE 93  BUN 8  CREATININE 0.81  CALCIUM 8.6*   GFR: CrCl cannot be calculated (Unknown ideal weight.). Liver Function Tests:  Recent Labs Lab 04/28/17 1210  AST 27  ALT 28  ALKPHOS 41  BILITOT 0.5  PROT 5.9*  ALBUMIN 3.5    Recent Labs Lab 04/28/17 1210  LIPASE 31   No results for input(s): AMMONIA in the last 168 hours. Coagulation Profile:  Recent Labs Lab 04/28/17 1347  INR 0.96   Cardiac Enzymes: No results for input(s): CKTOTAL, CKMB, CKMBINDEX, TROPONINI in the last 168 hours. BNP (last 3 results) No results for input(s): PROBNP in the last  8760 hours. HbA1C: No results for input(s): HGBA1C in the last 72 hours. CBG: No results for input(s): GLUCAP in the last 168 hours. Lipid Profile: No results for input(s): CHOL, HDL, LDLCALC, TRIG, CHOLHDL, LDLDIRECT in the last 72 hours. Thyroid Function Tests: No results for input(s): TSH, T4TOTAL, FREET4, T3FREE, THYROIDAB in the last 72 hours. Anemia Panel: No results for input(s): VITAMINB12, FOLATE, FERRITIN, TIBC, IRON, RETICCTPCT in the last 72 hours. Urine analysis:    Component Value Date/Time   COLORURINE YELLOW 04/28/2017 1300   APPEARANCEUR CLEAR 04/28/2017 1300   LABSPEC 1.011 04/28/2017 1300   PHURINE 7.0 04/28/2017 1300   GLUCOSEU NEGATIVE 04/28/2017 1300   HGBUR NEGATIVE 04/28/2017 1300   BILIRUBINUR NEGATIVE 04/28/2017 1300   KETONESUR NEGATIVE 04/28/2017 1300   PROTEINUR NEGATIVE 04/28/2017 1300   UROBILINOGEN 0.2 06/17/2012 0538   NITRITE NEGATIVE 04/28/2017 1300   LEUKOCYTESUR MODERATE (A) 04/28/2017 1300    Radiological Exams on Admission: Dg Chest 2 View  Result Date: 04/28/2017 CLINICAL DATA:  Cough for 7 days EXAM: CHEST  2 VIEW COMPARISON:  12/11/2009 FINDINGS: Normal heart size and mediastinal contours. No acute infiltrate or edema. No effusion or pneumothorax. No acute osseous findings. Rounded calcification over the abdomen in the lateral projection not clearly localized to the kidneys on the AP view. IMPRESSION: Negative chest. Electronically Signed   By: Marnee Spring M.D.   On: 04/28/2017 09:37      Assessment/Plan Active Problems:   Hematochezia   Hematochezia/ Lower GI bleed:  Admitted to med surg. Transfuse to keep hemoglobin greater than 7.  CT abd pelvis unremarkable.  GI consulted and plan for colonoscopy in am.   Fecal occult blood negative. Because of his h/o night sweats, weight loss and chills, evaluate for IBD.     Cough with productive sputum with streaks of blood occasionally.  Start azithromycin and get HIV antibody.  Sputum cultures.    Blood loss anemia:  From rectal bleed.  Anemia panel in am.     DVT prophylaxis: scd's Code Status: full code. Family Communication: none at bedside, discussed with mom at bedside.  Disposition Plan: pending colonoscopy.  Consults called: GI consult.  Admission status: inpatient med surg.    Kathlen Mody MD [facility] Pager [phone]  If 7PM-7AM, please contact night-coverage [email or web address] Password [email or web address]  04/28/2017, 4:51 PM

## 2017-04-28 NOTE — ED Notes (Signed)
Hospitalists at bedside.  

## 2017-04-28 NOTE — ED Provider Notes (Signed)
[facility]-EMERGENCY DEPT Provider Note   CSN: [id number] Arrival date & time: 04/28/17  4098     History   Chief Complaint Chief Complaint  Patient presents with  . Cough  . GI Bleeding    HPI [name] [name] is a 31 y.o. male.  HPI   [name] [name] is a 31 y.o. male, with a history of MDD, presenting to the ED with productive cough for the last 8 days. Endorses "stringy" hemoptysis.  Also complains of rectal bleeding for the last 4 days. He endorses bright red bleeding and dark stools. Colored the water in the toilet. Has had this issue intermittently for the past year, but has not seen anyone outpatient for this issue.  Endorses a single episode two days ago of sudden onset of sharp periumbilical abdominal pain, radiating inferiorly, lasted for about 20 minutes, followed by urgency to have a BM, followed by hematochezia. Symptoms do not seem to be associated with oral intake. Similar episodes over the past year.  Endorses daily marijuana use. States smoking does not seem to worsen any of his symptoms. Denies alcohol use.   Denies fever, N/V, shortness of breath, CP, or any other complaints.   History reviewed. No pertinent past medical history.  Patient Active Problem List   Diagnosis Date Noted  . MDD (major depressive disorder), recurrent episode, severe (HCC) 08/07/2016    History reviewed. No pertinent surgical history.     Home Medications    Prior to Admission medications   Not on File    Family History No family history on file.  Social History Social History  Substance Use Topics  . Smoking status: Former Smoker    Types: Cigars  . Smokeless tobacco: Never Used  . Alcohol use No     Allergies   Tomato   Review of Systems Review of Systems  Constitutional: Negative for appetite change, chills and fever.  Respiratory: Positive for cough. Negative for shortness of breath.   Cardiovascular: Negative for chest pain.  Gastrointestinal: Positive for  abdominal pain (resolved), blood in stool and diarrhea. Negative for constipation, nausea and vomiting.  Genitourinary: Negative for dysuria, frequency and hematuria.  All other systems reviewed and are negative.    Physical Exam Updated Vital Signs BP (!) 129/94   Pulse 61   Temp 98.2 F (36.8 C) (Oral)   Resp 16   SpO2 100%   Physical Exam  Constitutional: He appears well-developed and well-nourished. No distress.  HENT:  Head: Normocephalic and atraumatic.  Eyes: Conjunctivae are normal.  Neck: Neck supple.  Cardiovascular: Normal rate, regular rhythm, normal heart sounds and intact distal pulses.   Pulmonary/Chest: Effort normal. No respiratory distress. He has wheezes in the right lower field and the left lower field.  Abdominal: Soft. There is no tenderness. There is no guarding.  Genitourinary: Rectal exam shows guaiac negative stool.  Genitourinary Comments: No external hemorrhoids, fissures, or lesions noted. No gross blood or stool burden. No rectal tenderness. Prostate is not enlarged or boggy and is nontender. RN, Magda Paganini, served as chaperone during the rectal exam.  Musculoskeletal: He exhibits no edema.  Lymphadenopathy:    He has no cervical adenopathy.  Neurological: He is alert.  Skin: Skin is warm and dry. He is not diaphoretic.  Psychiatric: He has a normal mood and affect. His behavior is normal.  Nursing note and vitals reviewed.    ED Treatments / Results  Labs (all labs ordered are listed, but only abnormal results are displayed) Labs  Reviewed  COMPREHENSIVE METABOLIC PANEL - Abnormal; Notable for the following:       Result Value   CO2 21 (*)    Calcium 8.6 (*)    Total Protein 5.9 (*)    All other components within normal limits  CBC WITH DIFFERENTIAL/PLATELET - Abnormal; Notable for the following:    WBC 2.7 (*)    Hemoglobin 19.7 (*)    HCT 54.1 (*)    MCH 34.1 (*)    MCHC 36.4 (*)    Platelets 50 (*)    All other components within normal  limits  URINALYSIS, ROUTINE W REFLEX MICROSCOPIC - Abnormal; Notable for the following:    Leukocytes, UA MODERATE (*)    Bacteria, UA FEW (*)    Squamous Epithelial / LPF 0-5 (*)    Non Squamous Epithelial 0-5 (*)    All other components within normal limits  RAPID URINE DRUG SCREEN, HOSP PERFORMED - Abnormal; Notable for the following:    Tetrahydrocannabinol POSITIVE (*)    All other components within normal limits  CBC WITH DIFFERENTIAL/PLATELET - Abnormal; Notable for the following:    WBC 3.7 (*)    RBC 3.25 (*)    Hemoglobin 10.8 (*)    HCT 30.7 (*)    All other components within normal limits  LIPASE, BLOOD  D-DIMER, QUANTITATIVE (NOT AT [facility])  ETHANOL  D-DIMER, QUANTITATIVE (NOT AT [facility])  PROTIME-INR  APTT  SEDIMENTATION RATE  C-REACTIVE PROTEIN  POC OCCULT BLOOD, ED    EKG  EKG Interpretation None       Radiology Dg Chest 2 View  Result Date: 04/28/2017 CLINICAL DATA:  Cough for 7 days EXAM: CHEST  2 VIEW COMPARISON:  12/11/2009 FINDINGS: Normal heart size and mediastinal contours. No acute infiltrate or edema. No effusion or pneumothorax. No acute osseous findings. Rounded calcification over the abdomen in the lateral projection not clearly localized to the kidneys on the AP view. IMPRESSION: Negative chest. Electronically Signed   By: Marnee Spring M.D.   On: 04/28/2017 09:37    Procedures Angiocath insertion Date/Time: 04/28/2017 11:15 AM Performed by: Anselm Pancoast Authorized by: Harolyn Rutherford C  Consent: Verbal consent obtained. Risks and benefits: risks, benefits and alternatives were discussed Consent given by: patient Patient understanding: patient states understanding of the procedure being performed Patient consent: the patient's understanding of the procedure matches consent given Procedure consent: procedure consent matches procedure scheduled Patient identity confirmed: verbally with patient and arm band Local anesthesia used:  no  Anesthesia: Local anesthesia used: no  Sedation: Patient sedated: no Patient tolerance: Patient tolerated the procedure well with no immediate complications Comments: 20-gauge in the left hand with positive flash, drawn back, and flush without pain. Blood collected for labs prior to flush.    (including critical care time)  Medications Ordered in ED Medications  ipratropium-albuterol (DUONEB) 0.5-2.5 (3) MG/3ML nebulizer solution 3 mL (3 mLs Nebulization Given 04/28/17 1133)  sodium chloride 0.9 % bolus 1,000 mL (1,000 mLs Intravenous New Bag/Given 04/28/17 1343)     Initial Impression / Assessment and Plan / ED Course  I have reviewed the triage vital signs and the nursing notes.  Pertinent labs & imaging results that were available during my care of the patient were reviewed by me and considered in my medical decision making (see chart for details).  Clinical Course as of Apr 28 1534  Fri Apr 28, 2017  1325 Lab called Dr. Clarene Duke to notify her that there may have  been an error with the CBC results.   [SJ]  1455 Discussed lab results with the patient. Agrees to the plan for admission.   [SJ]  1531 Spoke with Dr. Blake Divine, hospitalist, who agrees to admit the patient. Requests abdominal CT.  [SJ]    Clinical Course User Index [SJ] Caress Reffitt C, PA-C    Patient presents with complaint of rectal bleeding. Patient is nontoxic appearing, afebrile, not tachycardic, not tachypneic, maintains SPO2 of 98-100% on room air. Patient will need admission for GI bleed with 3 point hemoglobin drop and concern for IBD by history.  Patient also noted to have wheezing on lung exam. Improved with DuoNeb. Steroids withheld due to concurrent GI bleed. Will advise hospitalist team.  Findings and plan of care discussed with Frederick Peers, MD. Dr. Clarene Duke personally evaluated and examined this patient.  Vitals:   04/28/17 1215 04/28/17 1230 04/28/17 1245 04/28/17 1248  BP: 106/89 93/77 125/81    Pulse: (!) 59 (!) 56 (!) 57 62  Resp:    20  Temp:      TempSrc:      SpO2: 98% 100% 100%       Final Clinical Impressions(s) / ED Diagnoses   Final diagnoses:  Gastrointestinal hemorrhage, unspecified gastrointestinal hemorrhage type    New Prescriptions New Prescriptions   No medications on file     Concepcion Living 04/28/17 1535    Little, Ambrose Finland, MD 04/30/17 2001

## 2017-04-28 NOTE — Consult Note (Signed)
[facility] Consult: 5:04 PM 04/28/2017  LOS: 0 days    Referring Provider: Dr Blake Divine  Primary Care Physician:  Default, Provider, MD Primary Gastroenterologist:   unassigned     Reason for Consultation:  Hematochezia.    HPI: [name] [name] is a 31 y.o. male.  PMH thrombocytopenia in 08/2016.  Scabies.  Facial cellulitis.  History major depression with attempted suicide by Tylenol overdose in 2002, suicide attempt in 2006 with ingestion of oxycodone and Clorox.  Patient came to the ED because of a cough which has been present for several days, this is slightly productive but not. 1. No fevers or chills.  > 1 year of regular rectal bleeding.  In recent weeks, has lower abdominal pain leading up to defecation.  Pain is intense.  Sees old and fresh looking blood at same time.  Occurs with and without stools.  Hgb 19.7 >> 10.8.  MCV 94.  Platelets 50 >>  167.  WBCs 2.7 >> 3.7.  Coags normal.  Chemistries unremarkable 08/2017 Hgb of 13.7.  Platelets 132K CXR is unremarkable in terms of cardiopulmonary findings.  However note is made of rounded calcifications over the abdomen on the lateral views not clearly localized to the kidneys on the AP view.  Denies rectal/anal sex.  Had nose bleed last year but nothing since.  No hx anemia or transfusions.  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  No NSAIDs, ASA.  Eats well but has had undetermined amount weight loss, he used to weigh 225#, in November he weighed in the 150s.  No n/v.  No ETOH, tobacco but does smoke pot.  No heavy lifting. Mother has "twisted intestines", he is not close so does not know details.  He is not aware of any inflammatory bowel disease or of gastrointestinal cancers running in the  family    History reviewed. No pertinent past medical history.  History reviewed. No pertinent surgical history.  Prior to Admission medications   Not on File    Scheduled Meds: . metoCLOPramide (REGLAN) injection  10 mg Intravenous Once   Followed by  . [START ON 04/29/2017] metoCLOPramide (REGLAN) injection  10 mg Intravenous Once  . polyethylene glycol powder  1 Container Oral Once   2Infusions: . azithromycin     PRN Meds:    Allergies as of 04/28/2017 - Review Complete 04/28/2017  Allergen Reaction Noted  . Tomato Anaphylaxis 06/17/2012    No family history on file.  Social History   Social History  . Marital status: Single    Spouse name: N/A  . Number of children: N/A  . Years of education: N/A   Occupational History  . Not on file.   Social History Main Topics  . Smoking status: Former Smoker    Types: Cigars  . Smokeless tobacco: Never Used  . Alcohol use No  . Drug use: Yes    Types: Marijuana  . Sexual activity: Not on file   Other Topics Concern  . Not on file  Social History Narrative  . No narrative on file    REVIEW OF SYSTEMS: Constitutional:  Teeth or weakness. ENT:   nose bleed last year. Pulm:  No shortness of breath. Mostly nonproductive cough. CV:  No palpitations, no LE edema. No chest pain GU:  No hematuria, no frequency GI:  Per HPI Heme:  Other than the GI bleeding he doesn't have any unusual bleeding or bruising tendencies.   Transfusions:  None ever. Neuro:  No headaches, no peripheral tingling or numbness Derm:  No itching, no rash or sores.  Endocrine:  No sweats or chills.  No polyuria or dysuria Immunization:  Did not inquire as to previous or recent immunizations. Travel:  None beyond local counties in last few months.    PHYSICAL EXAM: Vital signs in last 24 hours: Vitals:   04/28/17 1530 04/28/17 1653  BP: 131/79 123/76  Pulse: 65 (!) 58  Resp:  18  Temp:    SpO2: 100% 100%   Wt Readings from Last 3  Encounters:  08/06/16 70 kg (154 lb 4.8 oz)  07/23/16 69.9 kg (154 lb)    General: Pleasant, comfortable, AAM. He doesn't look ill. Head:  No asymmetry or facial swelling  Eyes:  No scleral icterus, no conjunctival pallor. Ears:  Not hard of hearing  Nose:  Congestion or discharge. Mouth:  Oropharynx is moist and clear. Tongue is midline. Neck:  No JVD, no masses, no thyromegaly. Lungs:  He has a slightly wet raspy cough. Lungs are clear. He is not dyspneic. Heart: RRR. No MRG. S1, S2 present. Abdomen:  Active bowel sounds. Firm abdomen with note distinct masses or organomegaly appreciated. Hernias..   Rectal: Patient's gluteal muscles and rectum very tight was not able to get my finger into the rectum. There is no blood externally. No hemorrhoids or masses externally.  Musc/Skeltl: No joint swelling, redness or deformities. Extremities:  No CCE.  Neurologic:  Alert. Oriented times 3. Moves all 4 limbs with full strength. No tremor. Skin:  No rashes, sores or significant bruising. Nodes:  No cervical adenopathy.   Psych:  Pleasant, slightly anxious, cooperative.  Intake/Output from previous day: No intake/output data recorded. Intake/Output this shift: No intake/output data recorded.  LAB RESULTS:  Recent Labs  04/28/17 1210 04/28/17 1347  WBC 2.7* 3.7*  HGB 19.7* 10.8*  HCT 54.1* 30.7*  PLT 50* 167   BMET Lab Results  Component Value Date   NA 140 04/28/2017   NA 138 08/23/2016   NA 140 08/06/2016   K 4.0 04/28/2017   K 4.3 08/23/2016   K 3.6 08/06/2016   CL 110 04/28/2017   CL 105 08/23/2016   CL 107 08/06/2016   CO2 21 (L) 04/28/2017   CO2 25 08/23/2016   CO2 25 08/06/2016   GLUCOSE 93 04/28/2017   GLUCOSE 97 08/23/2016   GLUCOSE 85 08/06/2016   BUN 8 04/28/2017   BUN 9 08/23/2016   BUN 8 08/06/2016   CREATININE 0.81 04/28/2017   CREATININE 1.13 08/23/2016   CREATININE 0.96 08/06/2016   CALCIUM 8.6 (L) 04/28/2017   CALCIUM 8.7 (L) 08/23/2016    CALCIUM 9.1 08/06/2016   LFT  Recent Labs  04/28/17 1210  PROT 5.9*  ALBUMIN 3.5  AST 27  ALT 28  ALKPHOS 41  BILITOT 0.5   PT/INR Lab Results  Component Value Date   INR 0.96 04/28/2017   Hepatitis Panel No results for input(s): HEPBSAG, HCVAB, HEPAIGM, HEPBIGM in the last 72 hours. C-Diff No  components found for: CDIFF Lipase     Component Value Date/Time   LIPASE 31 04/28/2017 1210    Drugs of Abuse     Component Value Date/Time   LABOPIA NONE DETECTED 04/28/2017 1300   COCAINSCRNUR NONE DETECTED 04/28/2017 1300   LABBENZ NONE DETECTED 04/28/2017 1300   AMPHETMU NONE DETECTED 04/28/2017 1300   THCU POSITIVE (A) 04/28/2017 1300   LABBARB NONE DETECTED 04/28/2017 1300     RADIOLOGY STUDIES: Dg Chest 2 View  Result Date: 04/28/2017 CLINICAL DATA:  Cough for 7 days EXAM: CHEST  2 VIEW COMPARISON:  12/11/2009 FINDINGS: Normal heart size and mediastinal contours. No acute infiltrate or edema. No effusion or pneumothorax. No acute osseous findings. Rounded calcification over the abdomen in the lateral projection not clearly localized to the kidneys on the AP view. IMPRESSION: Negative chest. Electronically Signed   By: Marnee Spring M.D.   On: 04/28/2017 09:37     IMPRESSION:   *  At least a years long history of rectal bleeding. Associated with abdominal pain. Weight loss may be attributed to a long period of time where he didn't have a car and had to walk a lot.  *  Widely variable CBC readings with hemoglobin between 19.7 and on recheck 10.8. Platelets varied from 50-167    PLAN:     *  Colonoscopy tomorrow. Discussed this with the patient and he is agreeable. I'm not sure how reliable he would be to return for outpatient colonoscopy.  *  The emergency department PA has ordered contrasted CT abdomen/pelvis.  Given his weight loss and history, this is appropriate.   Jennye Moccasin  04/28/2017, 5:04 PM Pager: [phone]      Attending physician's note     I have taken a history, examined the patient and reviewed the chart. I agree with the Advanced Practitioner's note, impression and recommendations. Intermittent, recurrent rectal bleeding for at least 1 year. Frequent lower abdominal pain prior to a bowel movement over past few weeks. Weight loss. Wide variation in CBC reading awaiting repeat CBC. An abd/pelvic CT has been ordered. Colonoscopy scheduled for tomorrow.   Claudette Head, MD [facility] [phone] Mon-Fri 8a-5p [phone] after 5p, weekends, holidays

## 2017-04-29 ENCOUNTER — Encounter (HOSPITAL_COMMUNITY)
Admission: EM | Disposition: A | Discharge status: Home / Self Care | Payer: Self-pay | Source: Home / Self Care | Attending: Internal Medicine

## 2017-04-29 ENCOUNTER — Encounter (HOSPITAL_COMMUNITY): Payer: Self-pay | Admitting: *Deleted

## 2017-04-29 DIAGNOSIS — D649 Anemia, unspecified: Secondary | ICD-10-CM

## 2017-04-29 DIAGNOSIS — K922 Gastrointestinal hemorrhage, unspecified: Secondary | ICD-10-CM

## 2017-04-29 DIAGNOSIS — J40 Bronchitis, not specified as acute or chronic: Secondary | ICD-10-CM

## 2017-04-29 HISTORY — PX: COLONOSCOPY: SHX5424

## 2017-04-29 LAB — HEMOGLOBIN AND HEMATOCRIT, BLOOD
HCT: 30.2 % — ABNORMAL LOW (ref 39.0–52.0)
HEMOGLOBIN: 10.8 g/dL — AB (ref 13.0–17.0)

## 2017-04-29 LAB — IRON AND TIBC
Iron: 98 ug/dL (ref 45–182)
SATURATION RATIOS: 36 % (ref 17.9–39.5)
TIBC: 270 ug/dL (ref 250–450)
UIBC: 172 ug/dL

## 2017-04-29 LAB — RETICULOCYTES
RBC.: 3.17 MIL/uL — AB (ref 4.22–5.81)
RETIC CT PCT: 1.1 % (ref 0.4–3.1)
Retic Count, Absolute: 34.9 10*3/uL (ref 19.0–186.0)

## 2017-04-29 LAB — FERRITIN: Ferritin: 20 ng/mL — ABNORMAL LOW (ref 24–336)

## 2017-04-29 LAB — VITAMIN B12: Vitamin B-12: 323 pg/mL (ref 180–914)

## 2017-04-29 LAB — HIV ANTIBODY (ROUTINE TESTING W REFLEX)
HIV SCREEN 4TH GENERATION: NONREACTIVE
HIV Screen 4th Generation wRfx: NONREACTIVE

## 2017-04-29 LAB — FOLATE: Folate: 13.8 ng/mL (ref >5.9)

## 2017-04-29 SURGERY — COLONOSCOPY
Anesthesia: Moderate Sedation

## 2017-04-29 MED ORDER — DIPHENHYDRAMINE HCL 50 MG/ML IJ SOLN
INTRAMUSCULAR | Status: AC
  Filled 2017-04-29: qty 1

## 2017-04-29 MED ORDER — FENTANYL CITRATE (PF) 100 MCG/2ML IJ SOLN
INTRAMUSCULAR | Status: AC
  Filled 2017-04-29: qty 4

## 2017-04-29 MED ORDER — SPOT INK MARKER SYRINGE KIT
PACK | SUBMUCOSAL | Status: AC
  Filled 2017-04-29: qty 5

## 2017-04-29 MED ORDER — GUAIFENESIN-DM 100-10 MG/5ML PO SYRP: 10.0000 mL | ORAL_SOLUTION | Freq: Four times a day (QID) | ORAL | 1 refills | Status: AC | PRN

## 2017-04-29 MED ORDER — EPINEPHRINE PF 1 MG/10ML IJ SOSY
PREFILLED_SYRINGE | INTRAMUSCULAR | Status: AC
  Filled 2017-04-29: qty 10

## 2017-04-29 MED ORDER — MIDAZOLAM HCL 5 MG/5ML IJ SOLN
INTRAMUSCULAR | Status: DC | PRN
  Administered 2017-04-29 (×2): 2 mg via INTRAVENOUS

## 2017-04-29 MED ORDER — FENTANYL CITRATE (PF) 100 MCG/2ML IJ SOLN
INTRAMUSCULAR | Status: DC | PRN
  Administered 2017-04-29 (×2): 25 ug via INTRAVENOUS

## 2017-04-29 MED ORDER — SODIUM CHLORIDE 0.9 % IV SOLN
INTRAVENOUS | Status: AC | PRN
  Administered 2017-04-29: 500 mL via INTRAVENOUS

## 2017-04-29 MED ORDER — MIDAZOLAM HCL 5 MG/ML IJ SOLN
INTRAMUSCULAR | Status: AC
  Filled 2017-04-29: qty 3

## 2017-04-29 MED ORDER — DIPHENHYDRAMINE HCL 50 MG/ML IJ SOLN
INTRAMUSCULAR | Status: DC | PRN
  Administered 2017-04-29: 25 mg via INTRAVENOUS

## 2017-04-29 MED ORDER — AZITHROMYCIN 500 MG PO TABS: 500.0000 mg | ORAL_TABLET | Freq: Every day | ORAL | 0 refills | Status: AC

## 2017-04-29 NOTE — Op Note (Signed)
[facility] Patient Name: [name] [name] Procedure Date : 04/29/2017 MRN: [id number] Attending MD: Charna Elizabeth , MD Date of Birth: [birth year] CSN: [id number] Age: 31 Admit Type: Inpatient Procedure:                Diagnostic colonoscopy. Indications:              Rectal bleeding; CRC screening for colorectal                            malignant neoplasm. Providers:                Charna Elizabeth, MD, Tomma Rakers, RN, Margo Aye, Technician. Referring MD:              Medicines:                Fentanyl 75 micrograms, Midazolam 7 mg &                            Diphenhydramine 25 mg IV Complications:            No immediate complications. Estimated Blood Loss:     Estimated blood loss: none. Procedure:                Pre-anesthesia assessment: - Prior to the                            procedure, a history and physical was performed,                            and patient medications and allergies were                            reviewed. The patient's tolerance of previous                            anesthesia was also reviewed. The risks and                            benefits of the procedure and the sedation options                            and risks were discussed with the patient. All                            questions were answered, and informed consent was                            obtained. Prior anticoagulants: The patient has                            taken no previous anticoagulant or antiplatelet                            agents. ASA  Grade assessment: II - A patient with                            mild systemic disease. After reviewing the risks                            and benefits, the patient was deemed in                            satisfactory condition to undergo the procedure.                            After obtaining informed consent, the colonoscope                            was passed under direct vision.  Throughout the                            procedure, the patient's blood pressure, pulse, and                            oxygen saturations were monitored continuously. The                            EC-[id number] ([id number]) scope was introduced through                            the anus and advanced to the the cecum, identified                            by appendiceal orifice and ileocecal valve. The                            colonoscopy was performed without difficulty. The                            patient tolerated the procedure well. The quality                            of the bowel preparation was adequate. The                            ileocecal valve, the appendiceal orifice and the                            rectum were photographed. The bowel preparation                            used was GoLYTELY. Scope In: 9:12:28 AM Scope Out: 9:27:51 AM Scope Withdrawal Time: 0 hours 10 minutes 7 seconds  Total Procedure Duration: 0 hours 15 minutes 23 seconds  Findings:      The entire examined portion of the colon appeared normal.      Prominent internal hemorrhoids were noted on retroflexion.  Tried to intubate the T.I but was unable to do so. Impression:               - The entire examined colon is normal.                           - Prominent internal hemorrhoids.                           - No specimens collected. Moderate Sedation:      Moderate (conscious) sedation was administered by the endoscopy nurse       and supervised by the endoscopist. The following parameters were       monitored: oxygen saturation, heart rate, blood pressure, respiratory       rate, EKG, adequacy of pulmonary ventilation, and response to care.       Total physician intraservice time was 27 minutes. Recommendation:           - High fiber diet with augmented water consumption                            daily.                           - Continue present medications.                           -  Avoid all NSAIDS.                           - Repeat colonoscopy at age 78 for surveillance. Procedure Code(s):        --- Professional ---                           [phone], Colonoscopy, flexible; diagnostic, including                            collection of specimen(s) by brushing or washing,                            when performed (separate procedure) Diagnosis Code(s):        --- Professional ---                           K62.5, Hemorrhage of anus and rectum                           Z12.11, Encounter for screening for malignant                            neoplasm of colon CPT copyright 2016 [employer]. All rights reserved. The codes documented in this report are preliminary and upon coder review may  be revised to meet current compliance requirements. Charna Elizabeth, MD Charna Elizabeth, MD 04/29/2017 9:40:12 AM This report has been signed electronically. Number of Addenda: 0

## 2017-04-29 NOTE — Discharge Summary (Signed)
Physician Discharge Summary  [name] [name] ZOX:[id number] DOB: 08-28-1986 DOA: 04/28/2017  PCP: Default, Provider, MD  Admit date: 04/28/2017 Discharge date: 04/29/2017  Admitted From: Home Disposition:  Home  Recommendations for Outpatient Follow-up:  1. Follow up with PCP in 1-2 weeks 2. Please obtain BMP/CBC in one week 3. Follow-up with gastroenterology in 2-3 weeks 4. Avoid marijuana   Home Health: No  Equipment/Devices: None  Discharge Condition: Stable  CODE STATUS: Full  Diet recommendation: High-fiber   Brief/Interim Summary: 31 year old male with history of rectal bleeding since one year presented with cough and increased rectal bleeding. Patient underwent colonoscopy today which showed prominent internal hemorrhoids. GI has cleared the patient for discharge.  Discharge Diagnoses:  Active Problems:   Hematochezia   Bronchitis   Anemia  Hematochezia/ Lower GI bleed secondary to internal hemorrhoids -  Patient had colonoscopy done today which showed internal hemorrhoids. Avoid NSAIDs and stick to high-fiber diet as per GI. Outpatient follow-up with GI in 2-3 weeks. Follow-up with primary care provider in a week's time with repeat CBC. Hemoglobin is 10.8 this morning CT abd pelvis unremarkable.   Cough probably due to bronchitis. Chest x-ray negative. Discharge home on oral Zithromax for 3 days  Blood loss anemia:  From rectal bleed.  Hemoglobin stable  Urine drug screen positive for marijuana - Advised the patient regarding abstinence from marijuana to help with the cough  Discharge Instructions  Discharge Instructions    Call MD for:  difficulty breathing, headache or visual disturbances    Complete by:  As directed    Call MD for:  extreme fatigue    Complete by:  As directed    Call MD for:  hives    Complete by:  As directed    Call MD for:  persistant dizziness or light-headedness    Complete by:  As directed    Call MD for:  persistant nausea and  vomiting    Complete by:  As directed    Call MD for:  severe uncontrolled pain    Complete by:  As directed    Call MD for:  temperature >100.4    Complete by:  As directed    Diet general    Complete by:  As directed    Discharge instructions    Complete by:  As directed    High fiber diet   Increase activity slowly    Complete by:  As directed      Allergies as of 04/29/2017      Reactions   Tomato Anaphylaxis      Medication List    TAKE these medications   azithromycin 500 MG tablet Commonly known as:  ZITHROMAX Take 1 tablet (500 mg total) by mouth daily. Take 1 tablet daily for 3 days.   guaiFENesin-dextromethorphan 100-10 MG/5ML syrup Commonly known as:  ROBITUSSIN DM Take 10 mLs by mouth every 6 (six) hours as needed for cough.      Follow-up Information    Charna Elizabeth, MD Follow up in 2 week(s).   Specialty:  Gastroenterology Why:  with repeat CBC Contact information: [address] [place] [address] [phone]          Allergies  Allergen Reactions  . Tomato Anaphylaxis    Consultations:  GI   Procedures/Studies: Dg Chest 2 View  Result Date: 04/28/2017 CLINICAL DATA:  Cough for 7 days EXAM: CHEST  2 VIEW COMPARISON:  12/11/2009 FINDINGS: Normal heart size and mediastinal contours. No  acute infiltrate or edema. No effusion or pneumothorax. No acute osseous findings. Rounded calcification over the abdomen in the lateral projection not clearly localized to the kidneys on the AP view. IMPRESSION: Negative chest. Electronically Signed   By: Marnee Spring M.D.   On: 04/28/2017 09:37   Ct Abdomen Pelvis W Contrast  Result Date: 04/28/2017 CLINICAL DATA:  Abdominal pain.  Rectal bleeding. EXAM: CT ABDOMEN AND PELVIS WITH CONTRAST TECHNIQUE: Multidetector CT imaging of the abdomen and pelvis was performed using the standard protocol following bolus administration of intravenous contrast. CONTRAST:  ISOVUE-300 IOPAMIDOL  (ISOVUE-300) INJECTION 61% COMPARISON:  None. FINDINGS: Lower chest: No significant pulmonary nodules or acute consolidative airspace disease. Hepatobiliary: Normal liver with no liver mass. Normal gallbladder with no radiopaque cholelithiasis. No biliary ductal dilatation. Pancreas: Normal, with no mass or duct dilation. Spleen: Normal size. No mass. Adrenals/Urinary Tract: Normal adrenals. Normal kidneys with no hydronephrosis and no renal mass. Normal bladder. Stomach/Bowel: Evaluation of the bowel is limited by the absence of oral contrast. Grossly normal stomach. Normal caliber small bowel with no small bowel wall thickening. Candidate normal appendix in the right lower quadrant (series 6/ image 65). No pericecal inflammatory changes. Normal large bowel with no diverticulosis, large bowel wall thickening or pericolonic fat stranding. Vascular/Lymphatic: Normal caliber abdominal aorta. Patent portal, splenic and renal veins. 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 No pathologically enlarged lymph nodes in the abdomen or pelvis. Reproductive: Normal size prostate. Other: No pneumoperitoneum. No focal fluid collections. Trace simple free fluid in the deep pelvis. Musculoskeletal: No aggressive appearing focal osseous lesions. IMPRESSION: 1. Bowel evaluation limited by the absence of oral contrast. No evidence of bowel obstruction or acute bowel inflammation. Candidate normal appendix in the right lower quadrant. No pericecal inflammatory changes. 2. Nonspecific trace simple free fluid in the deep pelvis. 3. Duplicated IVC. Electronically Signed   By: Delbert Phenix M.D.   On: 04/28/2017 18:23    Colonoscopy on 04/29/2017: Impression:               - The entire examined colon is normal.                           - Prominent internal hemorrhoids.                           - No specimens collected.   Subjective: Patient seen and examined at bedside. He denies any overnight fever, nausea or  vomiting. He had colonoscopy done today.  Discharge Exam: Vitals:   04/29/17 0930 04/29/17 0945  BP: (!) 125/53 111/60  Pulse: 67 (!) 58  Resp: 19 18  Temp:    SpO2: 100% 100%   Vitals:   04/29/17 0920 04/29/17 0925 04/29/17 0930 04/29/17 0945  BP: (!) 140/118 (!) 153/85 (!) 125/53 111/60  Pulse: 61 74 67 (!) 58  Resp: 15 19 19 18   Temp:      TempSrc:      SpO2: 100% 100% 100% 100%    General: Pt is alert, awake, not in acute distress Cardiovascular: Intermittent mild bradycardia, S1/S2 + Respiratory: Bilateral decreased breath sounds at bases  Abdominal: Soft, NT, ND, bowel sounds + Extremities: no edema, no cyanosis    The results of significant diagnostics from this hospitalization (including imaging, microbiology, ancillary and laboratory) are listed below for reference.     Microbiology: No results found for  this or any previous visit (from the past 240 hour(s)).   Labs: BNP (last 3 results) No results for input(s): BNP in the last 8760 hours. Basic Metabolic Panel:  Recent Labs Lab 04/28/17 1210  NA 140  K 4.0  CL 110  CO2 21*  GLUCOSE 93  BUN 8  CREATININE 0.81  CALCIUM 8.6*   Liver Function Tests:  Recent Labs Lab 04/28/17 1210  AST 27  ALT 28  ALKPHOS 41  BILITOT 0.5  PROT 5.9*  ALBUMIN 3.5    Recent Labs Lab 04/28/17 1210  LIPASE 31   No results for input(s): AMMONIA in the last 168 hours. CBC:  Recent Labs Lab 04/28/17 1210 04/28/17 1347 04/28/17 2111 04/29/17 0359  WBC 2.7* 3.7*  --   --   NEUTROABS 1.7 1.7  --   --   HGB 19.7* 10.8* 10.9* 10.8*  HCT 54.1* 30.7* 31.5* 30.2*  MCV 93.8 94.5  --   --   PLT 50* 167  --   --    Cardiac Enzymes: No results for input(s): CKTOTAL, CKMB, CKMBINDEX, TROPONINI in the last 168 hours. BNP: Invalid input(s): POCBNP CBG: No results for input(s): GLUCAP in the last 168 hours. D-Dimer  Recent Labs  04/28/17 1210 04/28/17 1347  DDIMER <0.27 <0.27   Hgb A1c No results for  input(s): HGBA1C in the last 72 hours. Lipid Profile No results for input(s): CHOL, HDL, LDLCALC, TRIG, CHOLHDL, LDLDIRECT in the last 72 hours. Thyroid function studies No results for input(s): TSH, T4TOTAL, T3FREE, THYROIDAB in the last 72 hours.  Invalid input(s): FREET3 Anemia work up  Recent Labs  04/29/17 0359  VITAMINB12 323  FOLATE 13.8  FERRITIN 20*  TIBC 270  IRON 98  RETICCTPCT 1.1   Urinalysis    Component Value Date/Time   COLORURINE YELLOW 04/28/2017 1300   APPEARANCEUR CLEAR 04/28/2017 1300   LABSPEC 1.011 04/28/2017 1300   PHURINE 7.0 04/28/2017 1300   GLUCOSEU NEGATIVE 04/28/2017 1300   HGBUR NEGATIVE 04/28/2017 1300   BILIRUBINUR NEGATIVE 04/28/2017 1300   KETONESUR NEGATIVE 04/28/2017 1300   PROTEINUR NEGATIVE 04/28/2017 1300   UROBILINOGEN 0.2 06/17/2012 0538   NITRITE NEGATIVE 04/28/2017 1300   LEUKOCYTESUR MODERATE (A) 04/28/2017 1300   Sepsis Labs Invalid input(s): PROCALCITONIN,  WBC,  LACTICIDVEN Microbiology No results found for this or any previous visit (from the past 240 hour(s)).   Time coordinating discharge: 35 minutes  SIGNED:   Glade Lloyd, MD  [facility] 04/29/2017, 12:53 PM Pager: [phone]  If 7PM-7AM, please contact night-coverage [email or web address] Password [email or web address]

## 2017-04-29 NOTE — Progress Notes (Signed)
Patient discharge teaching given, including activity, diet, follow-up appoints, and medications. Patient verbalized understanding of all discharge instructions. IV access was d/c'd. Vitals are stable. Skin is intact except as charted in most recent assessments. Pt to be escorted out by NT, to be driven home by family. 

## 2017-05-01 ENCOUNTER — Encounter (HOSPITAL_COMMUNITY): Payer: Self-pay | Admitting: Gastroenterology

## 2017-10-27 ENCOUNTER — Emergency Department (HOSPITAL_COMMUNITY)
Admission: EM | Admit: 2017-10-27 | Discharge: 2017-10-27 | Disposition: A | Discharge status: Home / Self Care | Payer: Self-pay | Attending: Emergency Medicine | Admitting: Emergency Medicine

## 2017-10-27 ENCOUNTER — Other Ambulatory Visit: Payer: Self-pay

## 2017-10-27 ENCOUNTER — Encounter (HOSPITAL_COMMUNITY): Payer: Self-pay | Admitting: *Deleted

## 2017-10-27 DIAGNOSIS — Z008 Encounter for other general examination: Secondary | ICD-10-CM | POA: Insufficient documentation

## 2017-10-27 DIAGNOSIS — Z139 Encounter for screening, unspecified: Secondary | ICD-10-CM

## 2017-10-27 DIAGNOSIS — Z87891 Personal history of nicotine dependence: Secondary | ICD-10-CM | POA: Insufficient documentation

## 2017-10-27 NOTE — ED Triage Notes (Signed)
The pt is here for a work note  He had a panic attack yesterday and he felt bad so he did not go to work  Now he cannot return to work until he has a note stating that he can go back to work.

## 2017-10-27 NOTE — ED Notes (Signed)
See EDP secondary assessment.  

## 2017-10-27 NOTE — ED Provider Notes (Addendum)
[facility] Provider Note   CSN: [id number] Arrival date & time: 10/27/17  1601     History   Chief Complaint Chief Complaint  Patient presents with  . Discharge Note    HPI Royston SinnerShaquil Bartleson is a 32 y.o. male.  Patient is a 32 year old male who is presenting today stating all he needs is a note stating he can return to work.  He states he has no pain or problems he does not need to be evaluated he just needs a note saying he can return to work.   The history is provided by the patient.    History reviewed. No pertinent past medical history.  Patient Active Problem List   Diagnosis Date Noted  . Hematochezia 04/28/2017  . Bronchitis 04/28/2017  . Anemia 04/28/2017  . MDD (major depressive disorder), recurrent episode, severe (HCC) 08/07/2016    Past Surgical History:  Procedure Laterality Date  . COLONOSCOPY N/A 04/29/2017   Procedure: COLONOSCOPY;  Surgeon: Charna ElizabethMann, Jyothi, MD;  [facility];  Service: Endoscopy;  Laterality: N/A;       Home Medications    Prior to Admission medications   Medication Sig Start Date End Date Taking? Authorizing Provider  guaiFENesin-dextromethorphan (ROBITUSSIN DM) 100-10 MG/5ML syrup Take 10 mLs by mouth every 6 (six) hours as needed for cough. 04/29/17   Glade LloydAlekh, Kshitiz, MD    Family History No family history on file.  Social History Social History   Tobacco Use  . Smoking status: Former Smoker    Types: Cigars  . Smokeless tobacco: Never Used  Substance Use Topics  . Alcohol use: No  . Drug use: Yes    Types: Marijuana     Allergies   Tomato   Review of Systems Review of Systems  Constitutional: Negative for fever.  Respiratory: Negative for cough and shortness of breath.      Physical Exam Updated Vital Signs BP 128/85 (BP Location: Left Arm)   Pulse 83   Temp 98.2 F (36.8 C) (Oral)   Resp 18   Ht 5\' 7"  (1.702 m)   Wt 63.5 kg (140 lb)   SpO2 100%   BMI  21.93 kg/m   Physical Exam  Constitutional: He is oriented to person, place, and time. He appears well-developed and well-nourished. No distress.  HENT:  Head: Normocephalic and atraumatic.  Cardiovascular: Normal rate.  Pulmonary/Chest: Effort normal.  Neurological: He is alert and oriented to person, place, and time.  Nursing note and vitals reviewed.    ED Treatments / Results  Labs (all labs ordered are listed, but only abnormal results are displayed) Labs Reviewed - No data to display  EKG  EKG Interpretation None       Radiology No results found.  Procedures Procedures (including critical care time)  Medications Ordered in ED Medications - No data to display   Initial Impression / Assessment and Plan / ED Course  I have reviewed the triage vital signs and the nursing notes.  Pertinent labs & imaging results that were available during my care of the patient were reviewed by me and considered in my medical decision making (see chart for details).     Patient is here for a note to return to work.  He did not have any further history taking or physical exam done at this time.  Final Clinical Impressions(s) / ED Diagnoses   Final diagnoses:  Encounter for medical screening examination    ED Discharge Orders  None       Gwyneth Sprout, MD 10/27/17 1737    Gwyneth Sprout, MD 11/07/17 2042

## 2018-10-28 IMAGING — CT CT ABD-PELV W/ CM
2 of 4 series · 15 of 46 positions shown, 17 images · IV contrast (iopamidol)
Comparison: None.

CLINICAL DATA: Abdominal pain.  Rectal bleeding.

EXAM:
CT ABDOMEN AND PELVIS WITH CONTRAST
TECHNIQUE: Multidetector CT imaging of the abdomen and pelvis was performed
using the standard protocol following bolus administration of
intravenous contrast.
CONTRAST:  100mL VDQ1RJ-OFF IOPAMIDOL (VDQ1RJ-OFF) INJECTION 61%

[Series 4: a/p w/ 5mm · axial · 0.65mm/px · z∈[+760,+1145]mm · 12 of 88 slices shown, 14 images]
[im 7/88  soft-tissue]
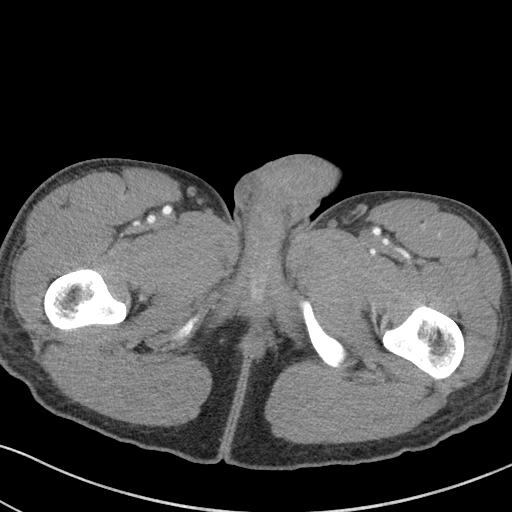
[im 7/88  bone]
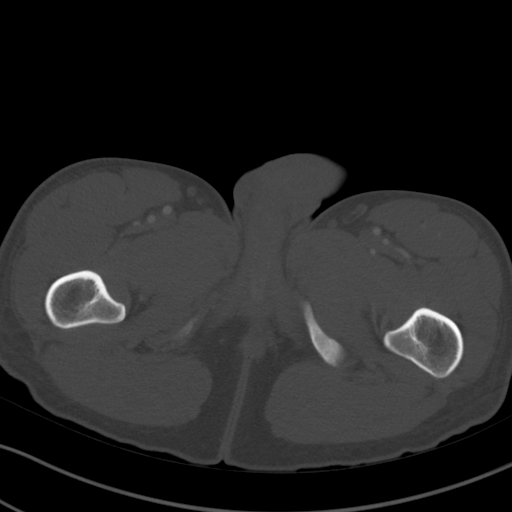
[im 14/88  soft-tissue]
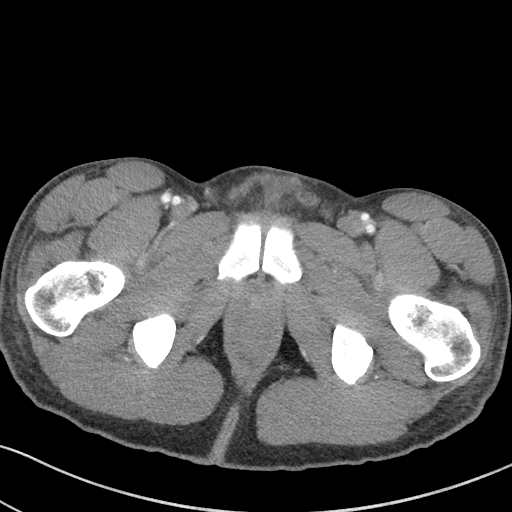
[im 21/88  soft-tissue]
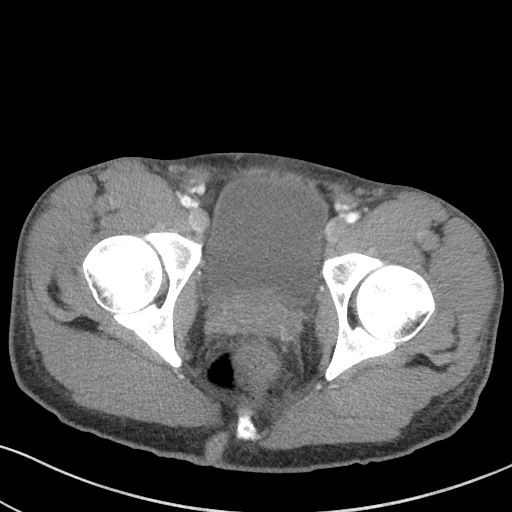
[im 28/88  soft-tissue]
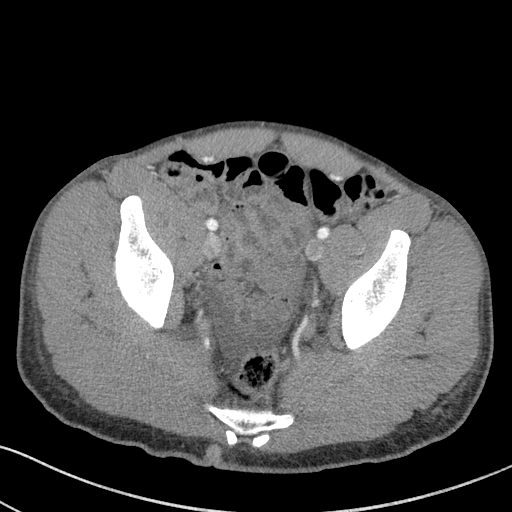
[im 35/88  soft-tissue]
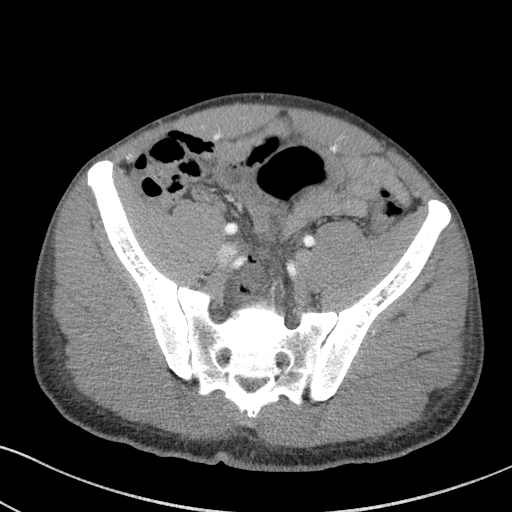
[im 42/88  soft-tissue]
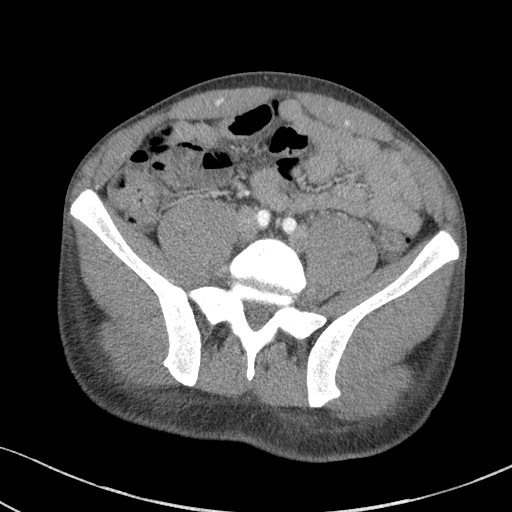
[im 49/88  soft-tissue]
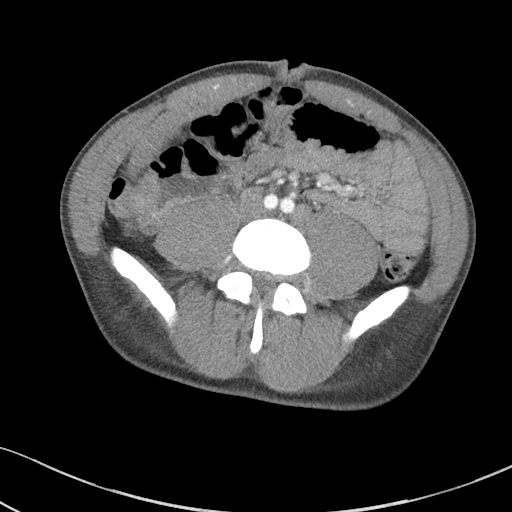
[im 56/88  soft-tissue]
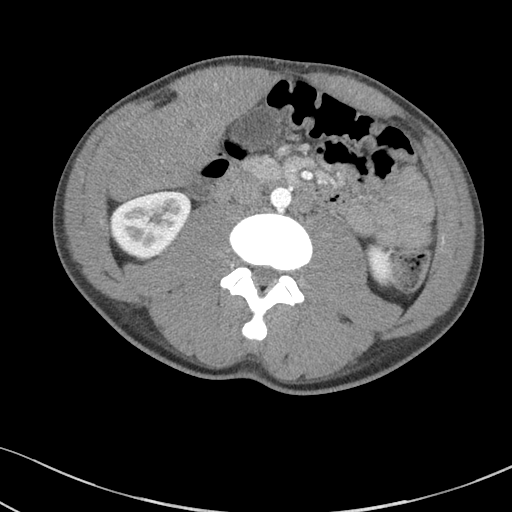
[im 63/88  soft-tissue]
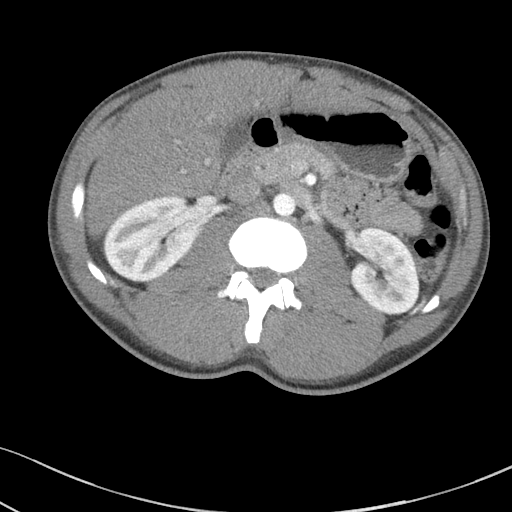
[im 63/88  bone]
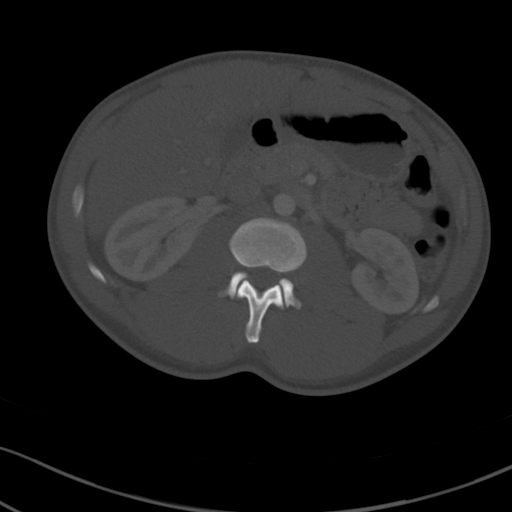
[im 70/88  soft-tissue]
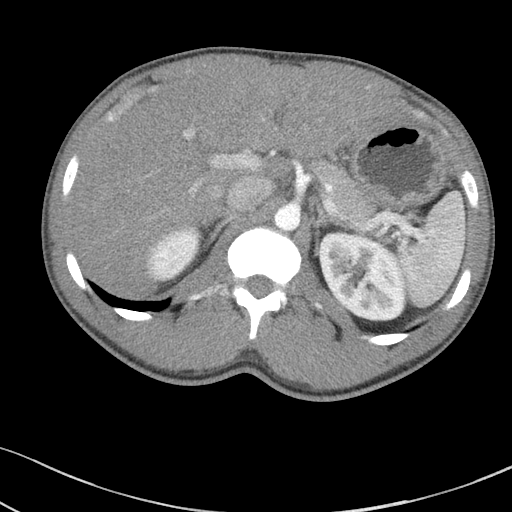
[im 77/88  soft-tissue]
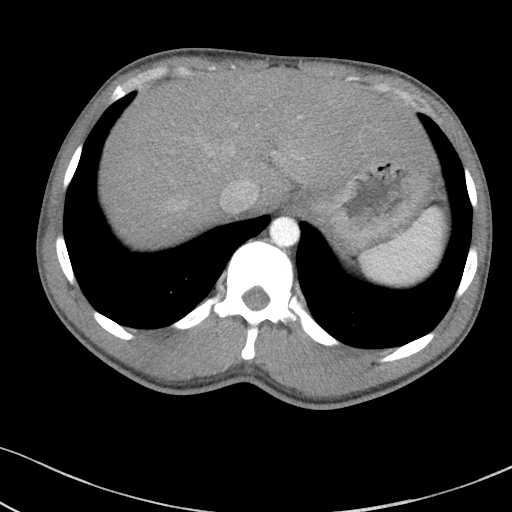
[im 84/88  soft-tissue]
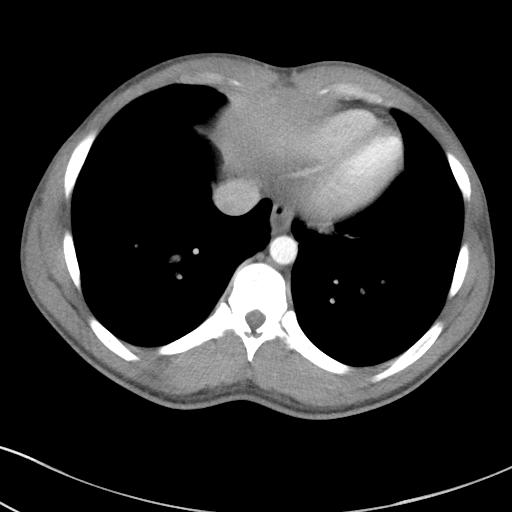

[Series 6: a/p w/ cor · coronal · 0.65mm/px · 3 of 143 slices shown]
[im 48/143  soft-tissue]
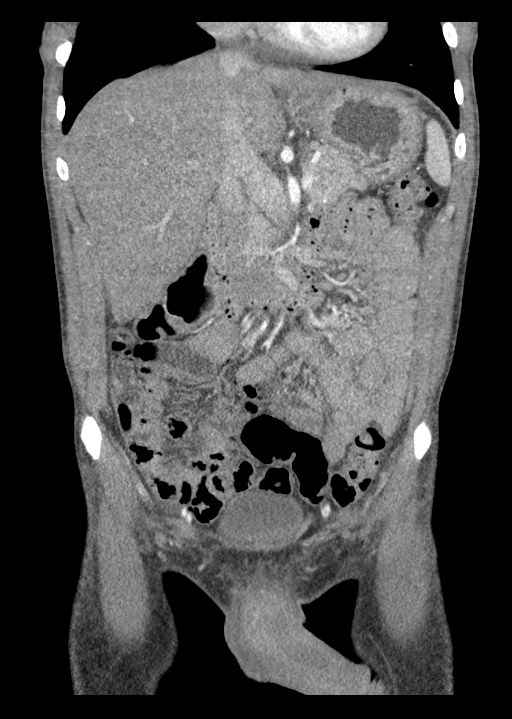
[im 64/143  soft-tissue]
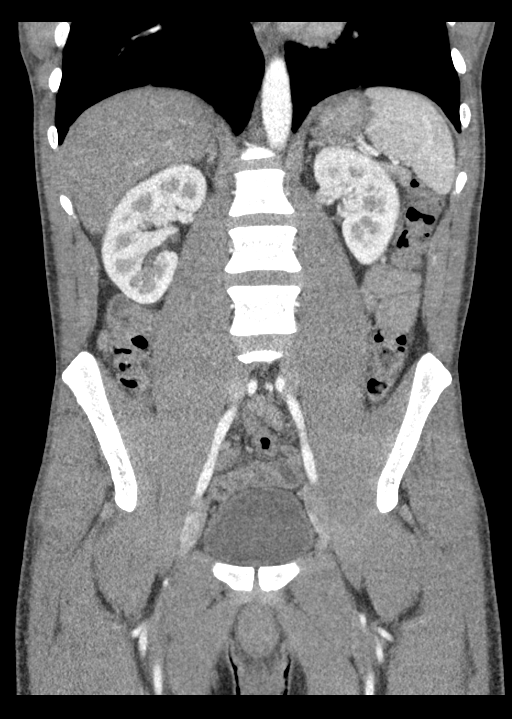
[im 79/143  soft-tissue]
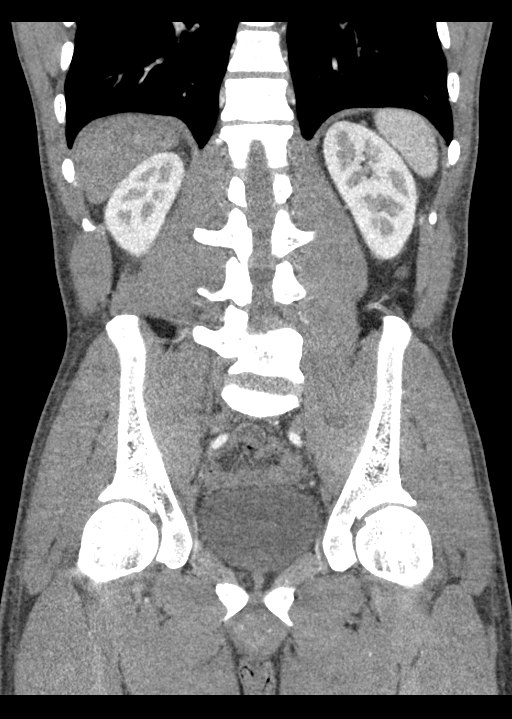

[15 of 46 positions shown; findings below may reference images not displayed]

FINDINGS: Lower chest: No significant pulmonary nodules or acute consolidative
airspace disease.

Hepatobiliary: Normal liver with no liver mass. Normal gallbladder
with no radiopaque cholelithiasis. No biliary ductal dilatation.

Pancreas: Normal, with no mass or duct dilation.

Spleen: Normal size. No mass.

Adrenals/Urinary Tract: Normal adrenals. Normal kidneys with no
hydronephrosis and no renal mass. Normal bladder.

Stomach/Bowel: Evaluation of the bowel is limited by the absence of
oral contrast. Grossly normal stomach. Normal caliber small bowel
with no small bowel wall thickening. Candidate normal appendix in
the right lower quadrant (series 6/ image 65). No pericecal
inflammatory changes. Normal large bowel with no diverticulosis,
large bowel wall thickening or pericolonic fat stranding.

Vascular/Lymphatic: Normal caliber abdominal aorta. Patent portal,
splenic and renal veins. Duplicated IVC below the level of the renal
veins. No pathologically enlarged lymph nodes in the abdomen or
pelvis.

Reproductive: Normal size prostate.

Other: No pneumoperitoneum. No focal fluid collections. Trace simple
free fluid in the deep pelvis.

Musculoskeletal: No aggressive appearing focal osseous lesions.
IMPRESSION: 1. Bowel evaluation limited by the absence of oral contrast. No
evidence of bowel obstruction or acute bowel inflammation. Candidate
normal appendix in the right lower quadrant. No pericecal
inflammatory changes.
2. Nonspecific trace simple free fluid in the deep pelvis.
3. Duplicated IVC.

## 2018-10-28 IMAGING — DX DG CHEST 2V
2 series · 2 of 2 positions shown · non-contrast
Comparison: 12/11/2009

CLINICAL DATA: Cough for 7 days

EXAM:
CHEST  2 VIEW

[chest pa]
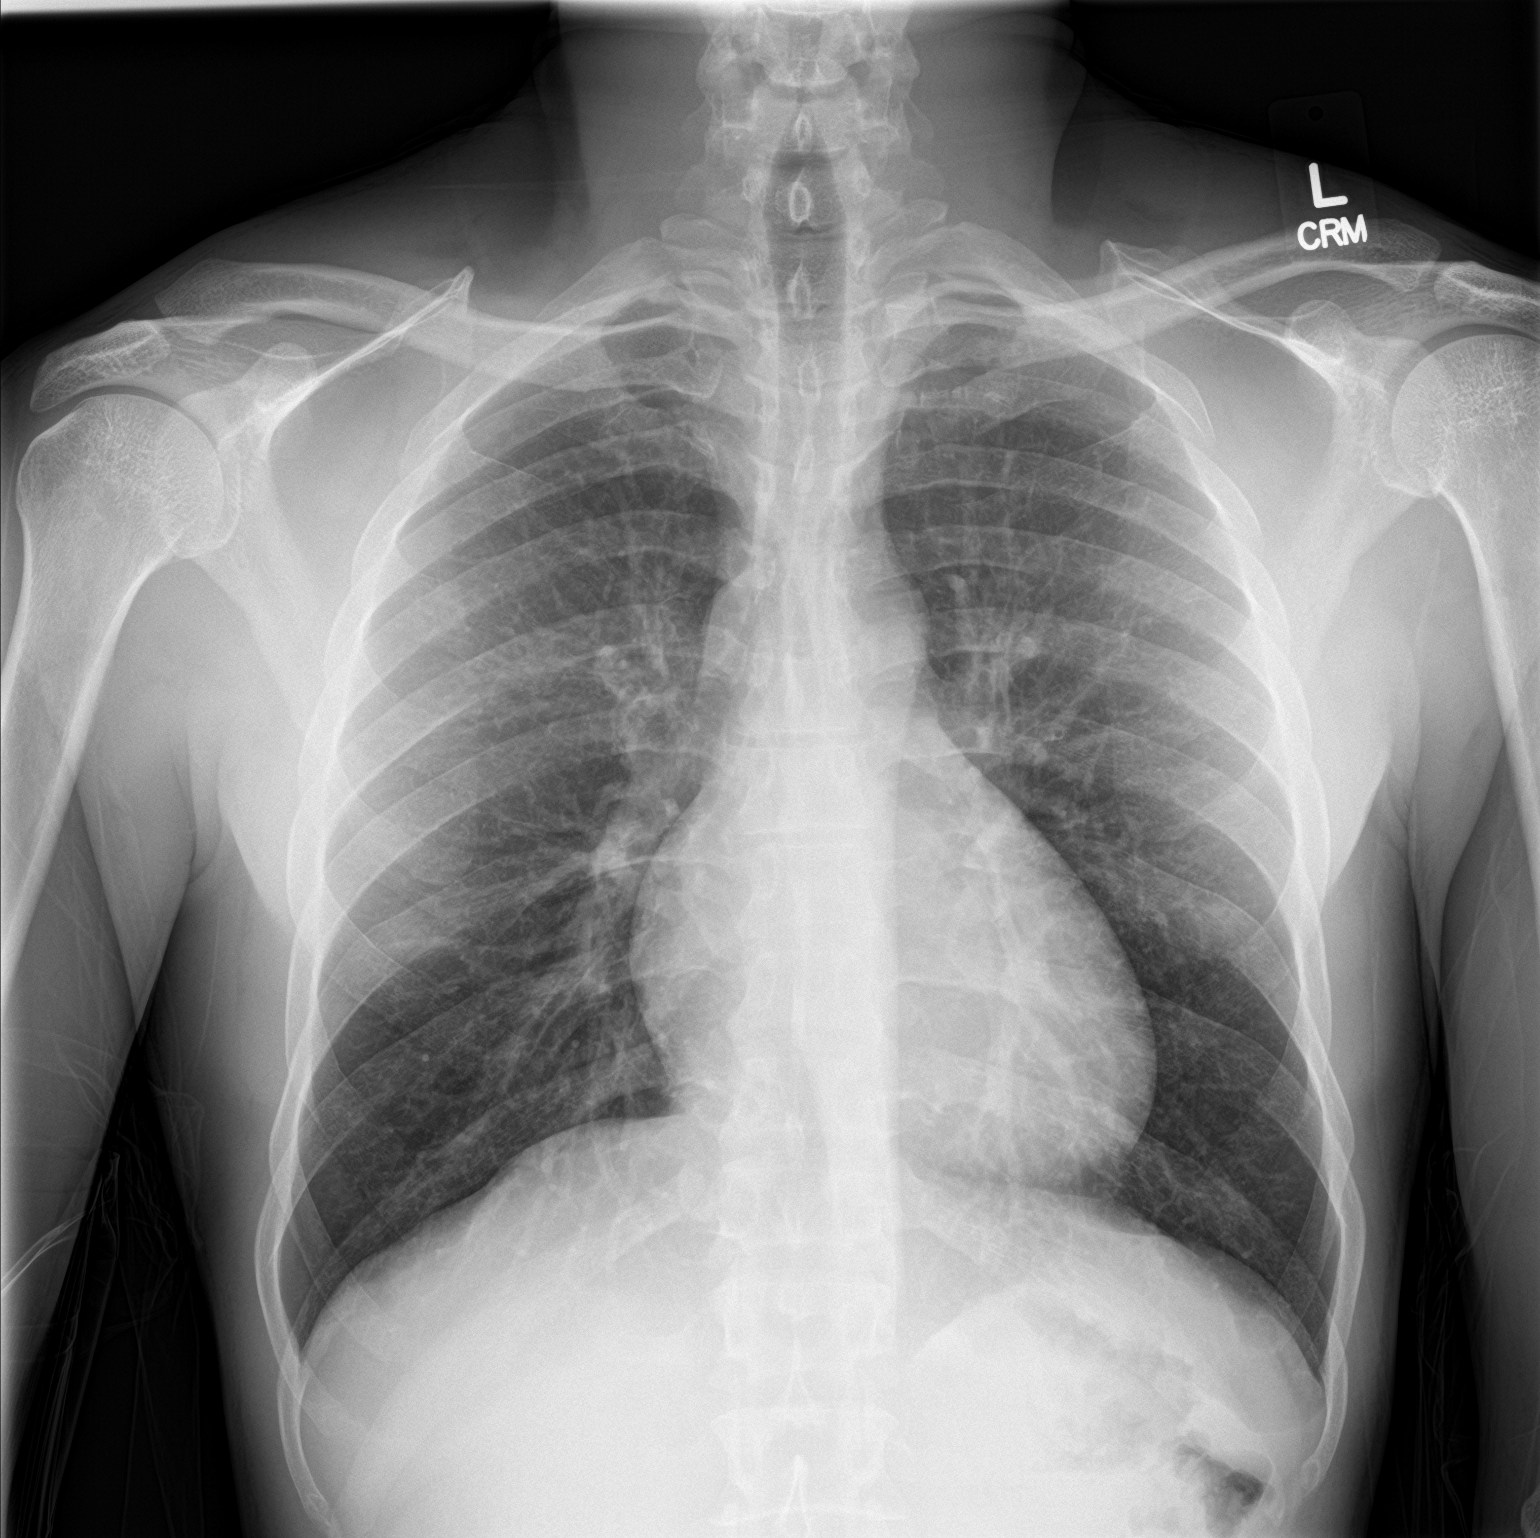

[chest lat]
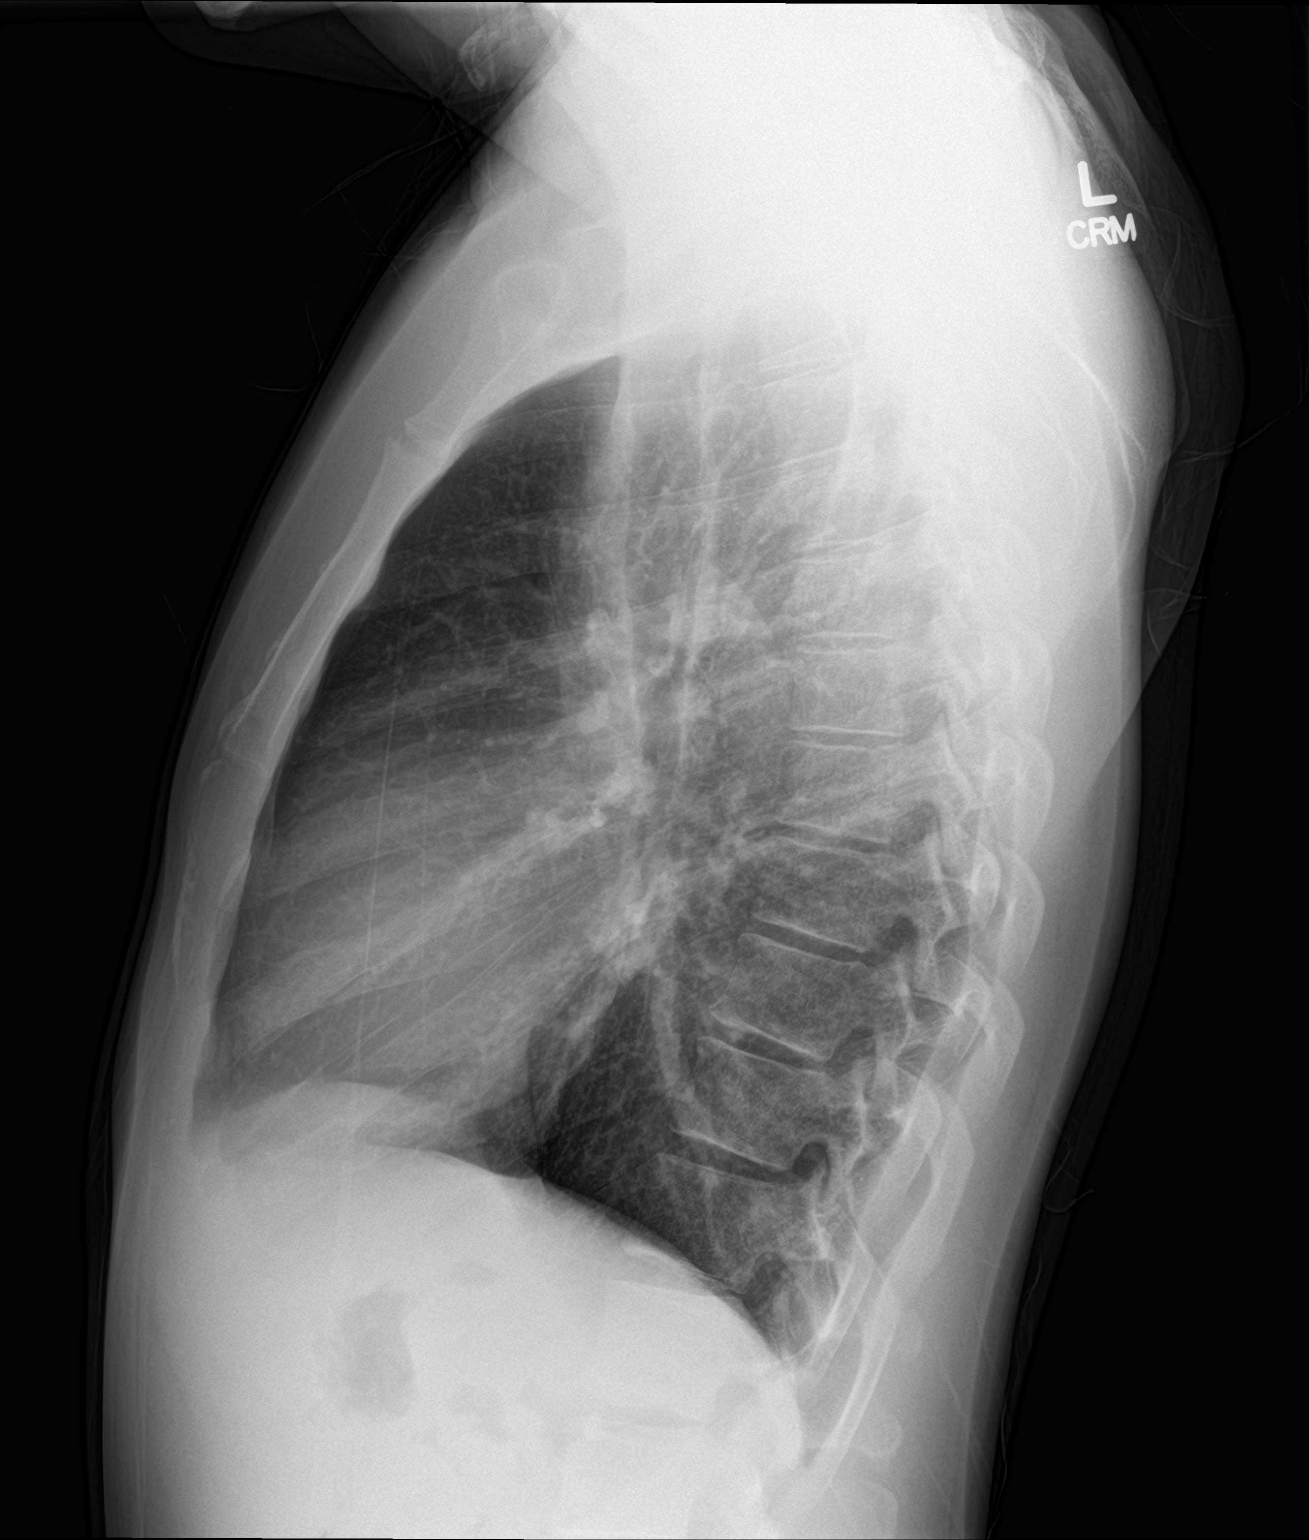

[2 of 2 positions shown; findings below may reference images not displayed]

FINDINGS: Normal heart size and mediastinal contours. No acute infiltrate or
edema. No effusion or pneumothorax. No acute osseous findings.
Rounded calcification over the abdomen in the lateral projection not
clearly localized to the kidneys on the AP view.
IMPRESSION: Negative chest.

## 2022-11-11 ENCOUNTER — Emergency Department (HOSPITAL_COMMUNITY): Payer: BC Managed Care – PPO

## 2022-11-11 ENCOUNTER — Emergency Department (HOSPITAL_COMMUNITY)
Admission: EM | Admit: 2022-11-11 | Discharge: 2022-11-11 | Disposition: A | Discharge status: Home / Self Care | Payer: BC Managed Care – PPO | Attending: Emergency Medicine | Admitting: Emergency Medicine

## 2022-11-11 ENCOUNTER — Other Ambulatory Visit: Payer: Self-pay

## 2022-11-11 DIAGNOSIS — S01411A Laceration without foreign body of right cheek and temporomandibular area, initial encounter: Secondary | ICD-10-CM | POA: Insufficient documentation

## 2022-11-11 DIAGNOSIS — S0181XA Laceration without foreign body of other part of head, initial encounter: Secondary | ICD-10-CM

## 2022-11-11 DIAGNOSIS — Y92009 Unspecified place in unspecified non-institutional (private) residence as the place of occurrence of the external cause: Secondary | ICD-10-CM | POA: Insufficient documentation

## 2022-11-11 DIAGNOSIS — Z23 Encounter for immunization: Secondary | ICD-10-CM | POA: Diagnosis not present

## 2022-11-11 DIAGNOSIS — W25XXXA Contact with sharp glass, initial encounter: Secondary | ICD-10-CM | POA: Diagnosis not present

## 2022-11-11 MED ORDER — CEPHALEXIN 500 MG PO CAPS: 500.0000 mg | ORAL_CAPSULE | Freq: Three times a day (TID) | ORAL | 0 refills | Status: AC

## 2022-11-11 MED ORDER — TETANUS-DIPHTH-ACELL PERTUSSIS 5-2.5-18.5 LF-MCG/0.5 IM SUSY
0.5000 mL | PREFILLED_SYRINGE | Freq: Once | INTRAMUSCULAR | Status: AC
  Administered 2022-11-11: 0.5 mL via INTRAMUSCULAR
  Filled 2022-11-11: qty 0.5

## 2022-11-11 MED ORDER — LIDOCAINE-EPINEPHRINE 1 %-1:100000 IJ SOLN
30.0000 mL | Freq: Once | INTRAMUSCULAR | Status: AC
  Administered 2022-11-11: 30 mL
  Filled 2022-11-11: qty 1

## 2022-11-11 NOTE — ED Notes (Signed)
Ed physician at bedside

## 2022-11-11 NOTE — ED Notes (Signed)
Pt returned from CT NAD will continue to monitor

## 2022-11-11 NOTE — ED Provider Notes (Signed)
I provided a substantive portion of the care of this patient.  I personally made/approved the management plan for this patient and take responsibility for the patient management. {Remember to document shared critical care using "edcritical" dot phrase:1}
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
## 2022-11-11 NOTE — ED Notes (Signed)
Pt to CT

## 2022-11-11 NOTE — Discharge Instructions (Signed)
You have been seen today for your complaint of facial laceration. Your discharge medications include Keflex.  This is an antibiotic. You should take it as prescribed. You should take it for the entire duration of the prescription. This may cause an upset stomach. This is normal. You may take this with food. You may also eat yogurt to prevent diarrhea.  Alternate tylenol and ibuprofen for pain. You may alternate these every 4 hours. You may take up to 800 mg of ibuprofen at a time and up to 1000 mg of tylenol.. Home care instructions are as follows:  Keep the area clean and dry for 24 hours.  Clean with warm soapy water once daily after that Follow up with: A primary care provider of your choice, any urgent care or emergency room in 5 days for suture removal Please seek immediate medical care if you develop any of the following symptoms: You have very bad swelling around the wound. Your pain suddenly gets worse and is very bad. You have painful lumps near the wound or on skin anywhere on your body. You have a red streak going away from your wound. The wound is on your hand or foot, and: You cannot move a finger or toe. Your fingers or toes look pale or bluish. At this time there does not appear to be the presence of an emergent medical condition, however there is always the potential for conditions to change. Please read and follow the below instructions.  Do not take your medicine if  develop an itchy rash, swelling in your mouth or lips, or difficulty breathing; call 911 and seek immediate emergency medical attention if this occurs.  You may review your lab tests and imaging results in their entirety on your MyChart account.  Please discuss all results of fully with your primary care provider and other specialist at your follow-up visit.  Note: Portions of this text may have been transcribed using voice recognition software. Every effort was made to ensure accuracy; however, inadvertent  computerized transcription errors may still be present.
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
## 2022-11-16 ENCOUNTER — Encounter (HOSPITAL_COMMUNITY): Payer: Self-pay

## 2022-11-16 ENCOUNTER — Emergency Department (HOSPITAL_COMMUNITY)
Admission: EM | Admit: 2022-11-16 | Discharge: 2022-11-16 | Disposition: A | Discharge status: Home / Self Care | Payer: Self-pay | Attending: Emergency Medicine | Admitting: Emergency Medicine

## 2022-11-16 ENCOUNTER — Other Ambulatory Visit: Payer: Self-pay

## 2022-11-16 DIAGNOSIS — Z4802 Encounter for removal of sutures: Secondary | ICD-10-CM

## 2022-11-16 NOTE — Discharge Instructions (Signed)
You have been seen today for your complaint of suture removal. Follow up with: Your primary care in 1 week for reevaluation of the laceration Please seek immediate medical care if you develop any of the following symptoms: Redness, warmth, drainage, separation of your wound At this time there does not appear to be the presence of an emergent medical condition, however there is always the potential for conditions to change. Please read and follow the below instructions.  Do not take your medicine if  develop an itchy rash, swelling in your mouth or lips, or difficulty breathing; call 911 and seek immediate emergency medical attention if this occurs.  You may review your lab tests and imaging results in their entirety on your MyChart account.  Please discuss all results of fully with your primary care provider and other specialist at your follow-up visit.  Note: Portions of this text may have been transcribed using voice recognition software. Every effort was made to ensure accuracy; however, inadvertent computerized transcription errors may still be present.

## 2022-11-16 NOTE — ED Provider Notes (Signed)
[facility] Provider Note   CSN: [id number] Arrival date & time: 11/16/22  1526     History  No chief complaint on file.   [name] [name] is a 37 y.o. male.  Who presents to the ED for evaluation of suture removal.  He sustained a facial laceration 5 days ago and was treated in the ED at that time.  States that his pain has been manageable and is typically a 1 out of 10.  Denies any drainage, swelling, erythema to the area.  HPI     Home Medications Prior to Admission medications   Medication Sig Start Date End Date Taking? Authorizing Provider  cephALEXin (KEFLEX) 500 MG capsule Take 1 capsule (500 mg total) by mouth 3 (three) times daily for 5 days. 11/11/22 11/16/22  Taher Vannote, Grafton Folk, PA-C  guaiFENesin-dextromethorphan (ROBITUSSIN DM) 100-10 MG/5ML syrup Take 10 mLs by mouth every 6 (six) hours as needed for cough. 04/29/17   Aline August, MD      Allergies    Tomato    Review of Systems   Review of Systems  Skin:  Positive for wound.  All other systems reviewed and are negative.   Physical Exam Updated Vital Signs BP 119/70 (BP Location: Right Arm)   Pulse 60   Temp 98.1 F (36.7 C)   Resp 17   Ht '5\' 7"'$  (1.702 m)   SpO2 100%   BMI 24.12 kg/m  Physical Exam Vitals and nursing note reviewed.  Constitutional:      General: He is not in acute distress.    Appearance: Normal appearance. He is normal weight. He is not ill-appearing.  HENT:     Head: Normocephalic and atraumatic.  Pulmonary:     Effort: Pulmonary effort is normal. No respiratory distress.  Abdominal:     General: Abdomen is flat.  Musculoskeletal:        General: Normal range of motion.     Cervical back: Neck supple.  Skin:    General: Skin is warm and dry.     Comments: Well-healing laceration to the right cheek with 5 nylon sutures intact  Neurological:     Mental Status: He is alert and oriented to person, place, and time.  Psychiatric:         Mood and Affect: Mood normal.        Behavior: Behavior normal.     ED Results / Procedures / Treatments   Labs (all labs ordered are listed, but only abnormal results are displayed) Labs Reviewed - No data to display  EKG None  Radiology No results found.  Procedures .Suture Removal  Date/Time: 11/16/2022 4:50 PM  Performed by: Roylene Reason, PA-C Authorized by: Roylene Reason, PA-C   Consent:    Consent obtained:  Verbal   Consent given by:  Patient   Risks discussed:  Bleeding and wound separation Universal protocol:    Patient identity confirmed:  Verbally with patient and arm band Location:    Location:  [place]   Head/neck location:  Cheek   Cheek location:  R cheek Procedure details:    Wound appearance:  No signs of infection, clean and good wound healing   Number of sutures removed:  5 Post-procedure details:    Post-removal:  No dressing applied   Procedure completion:  Tolerated well, no immediate complications     Medications Ordered in ED Medications - No data to display  ED Course/ Medical Decision Making/  A&P                             Medical Decision Making This patient presents to the ED for concern of facial laceration with suture removal  My initial workup includes suture removal  Additional history obtained from: Nursing notes from this visit.  Afebrile, hemodynamically stable.  37 year old male presents ED for evaluation of suture removal.  Sustained facial laceration 5 days ago which was closed at that time.  Reports no complications since then.  Wound appears to be healing very well.  Sutures were removed as above.  Wound remained closed.  No signs of infection.  He states he has no pain at this time.  He reports full facial muscle movements.  He was encouraged to follow-up with his primary care provider in 1 week for reevaluation of his laceration.  Stable at discharge.  At this time there does not appear to be any  evidence of an acute emergency medical condition and the patient appears stable for discharge with appropriate outpatient follow up. Diagnosis was discussed with patient who verbalizes understanding of care plan and is agreeable to discharge. I have discussed return precautions with patient and significant other who verbalizes understanding. Patient encouraged to follow-up with their PCP within 1 week. All questions answered.  Note: Portions of this report may have been transcribed using voice recognition software. Every effort was made to ensure accuracy; however, inadvertent computerized transcription errors may still be present.        Final Clinical Impression(s) / ED Diagnoses Final diagnoses:  Visit for suture removal    Rx / DC Orders ED Discharge Orders     None         Roylene Reason, Hershal Coria 11/16/22 1706    Fransico Meadow, MD 11/20/22 1331

## 2022-11-16 NOTE — ED Triage Notes (Signed)
Pt came in via POV to get his stitches removed from his Rt cheek. A/Ox4 in triage, denies pain.

## 2023-03-22 ENCOUNTER — Emergency Department (HOSPITAL_COMMUNITY): Payer: BC Managed Care – PPO

## 2023-03-22 ENCOUNTER — Other Ambulatory Visit: Payer: Self-pay

## 2023-03-22 ENCOUNTER — Encounter (HOSPITAL_COMMUNITY): Payer: Self-pay

## 2023-03-22 ENCOUNTER — Emergency Department (HOSPITAL_COMMUNITY)
Admission: EM | Admit: 2023-03-22 | Discharge: 2023-03-22 | Disposition: A | Discharge status: Home / Self Care | Payer: BC Managed Care – PPO | Attending: Emergency Medicine | Admitting: Emergency Medicine

## 2023-03-22 DIAGNOSIS — R55 Syncope and collapse: Secondary | ICD-10-CM | POA: Insufficient documentation

## 2023-03-22 DIAGNOSIS — H539 Unspecified visual disturbance: Secondary | ICD-10-CM | POA: Insufficient documentation

## 2023-03-22 LAB — CBC WITH DIFFERENTIAL/PLATELET
Abs Immature Granulocytes: 0 10*3/uL (ref 0.00–0.07)
Basophils Absolute: 0 10*3/uL (ref 0.0–0.1)
Basophils Relative: 0 %
Eosinophils Absolute: 0.2 10*3/uL (ref 0.0–0.5)
Eosinophils Relative: 7 %
HCT: 34.6 % — ABNORMAL LOW (ref 39.0–52.0)
Hemoglobin: 12.1 g/dL — ABNORMAL LOW (ref 13.0–17.0)
Immature Granulocytes: 0 %
Lymphocytes Relative: 40 %
Lymphs Abs: 1.4 10*3/uL (ref 0.7–4.0)
MCH: 34.5 pg — ABNORMAL HIGH (ref 26.0–34.0)
MCHC: 35 g/dL (ref 30.0–36.0)
MCV: 98.6 fL (ref 80.0–100.0)
Monocytes Absolute: 0.4 10*3/uL (ref 0.1–1.0)
Monocytes Relative: 12 %
Neutro Abs: 1.4 10*3/uL — ABNORMAL LOW (ref 1.7–7.7)
Neutrophils Relative %: 41 %
Platelets: 168 10*3/uL (ref 150–400)
RBC: 3.51 MIL/uL — ABNORMAL LOW (ref 4.22–5.81)
RDW: 13.3 % (ref 11.5–15.5)
WBC: 3.4 10*3/uL — ABNORMAL LOW (ref 4.0–10.5)
nRBC: 0 % (ref 0.0–0.2)

## 2023-03-22 LAB — COMPREHENSIVE METABOLIC PANEL
ALT: 31 U/L (ref 0–44)
AST: 26 U/L (ref 15–41)
Albumin: 3.5 g/dL (ref 3.5–5.0)
Alkaline Phosphatase: 56 U/L (ref 38–126)
Anion gap: 8 (ref 5–15)
BUN: 9 mg/dL (ref 6–20)
CO2: 23 mmol/L (ref 22–32)
Calcium: 8.5 mg/dL — ABNORMAL LOW (ref 8.9–10.3)
Chloride: 106 mmol/L (ref 98–111)
Creatinine, Ser: 0.8 mg/dL (ref 0.61–1.24)
GFR, Estimated: >60 mL/min (ref >60)
Glucose, Bld: 102 mg/dL — ABNORMAL HIGH (ref 70–99)
Potassium: 3.7 mmol/L (ref 3.5–5.1)
Sodium: 137 mmol/L (ref 135–145)
Total Bilirubin: 0.2 mg/dL — ABNORMAL LOW (ref 0.3–1.2)
Total Protein: 6.7 g/dL (ref 6.5–8.1)

## 2023-03-22 LAB — MAGNESIUM: Magnesium: 1.8 mg/dL (ref 1.7–2.4)

## 2023-03-22 LAB — URINALYSIS, ROUTINE W REFLEX MICROSCOPIC
Bacteria, UA: NONE SEEN
Bilirubin Urine: NEGATIVE
Glucose, UA: NEGATIVE mg/dL
Hgb urine dipstick: NEGATIVE
Ketones, ur: NEGATIVE mg/dL
Nitrite: NEGATIVE
Protein, ur: NEGATIVE mg/dL
Specific Gravity, Urine: 1.009 (ref 1.005–1.030)
pH: 6 (ref 5.0–8.0)

## 2023-03-22 LAB — RAPID URINE DRUG SCREEN, HOSP PERFORMED
Amphetamines: NOT DETECTED
Barbiturates: NOT DETECTED
Benzodiazepines: NOT DETECTED
Cocaine: NOT DETECTED
Opiates: NOT DETECTED
Tetrahydrocannabinol: POSITIVE — AB

## 2023-03-22 LAB — ETHANOL: Alcohol, Ethyl (B): <10 mg/dL (ref <10)

## 2023-03-22 MED ORDER — FLUORESCEIN SODIUM 1 MG OP STRP
1.0000 | ORAL_STRIP | Freq: Once | OPHTHALMIC | Status: AC
  Administered 2023-03-22: 1 via OPHTHALMIC
  Filled 2023-03-22: qty 1

## 2023-03-22 MED ORDER — SODIUM CHLORIDE 0.9 % IV BOLUS
1000.0000 mL | Freq: Once | INTRAVENOUS | Status: AC
  Administered 2023-03-22: 1000 mL via INTRAVENOUS

## 2023-03-22 MED ORDER — TETRACAINE HCL 0.5 % OP SOLN
2.0000 [drp] | Freq: Once | OPHTHALMIC | Status: AC
  Administered 2023-03-22: 2 [drp] via OPHTHALMIC
  Filled 2023-03-22: qty 4

## 2023-03-22 MED ORDER — SODIUM CHLORIDE 0.9 % IV SOLN: INTRAVENOUS | Status: DC

## 2023-03-22 NOTE — ED Notes (Signed)
Pt wanted to leave AMA. MD made aware. AMA consent form signed.

## 2023-03-22 NOTE — ED Provider Notes (Signed)
[name] EMERGENCY DEPARTMENT AT [facility] Provider Note   CSN: [id number] Arrival date & time: 03/22/23  1144     History  Chief Complaint  Patient presents with   Loss of Consciousness   possibel seizure     [name] [name] is a 37 y.o. male.  Pt is a 37 yo male with pmhx significant for anxiety and ibs. Pt has had several syncopal events today.  This am, his wife found him on the ground in their room after she came out of the shower.  He did have some twitching.  She took a video and showed it to me.  It looked like a few myoclonic jerks.  She said they were arguing before she took her shower.  He dropped her off at work and had another syncopal event in the car.  Details get a little hazy here, but he was found in the parking lot.  There is a question of whether or not he hit his head.  He complains of abd pain which is chronic as well as right eye pain.  He is not seeing as well out of his right eye.  Pt does admit to a lot of stress.  He and his wife        Home Medications Prior to Admission medications   Medication Sig Start Date End Date Taking? Authorizing Provider  guaiFENesin-dextromethorphan (ROBITUSSIN DM) 100-10 MG/5ML syrup Take 10 mLs by mouth every 6 (six) hours as needed for cough. 04/29/17   Glade Lloyd, MD      Allergies    Tomato    Review of Systems   Review of Systems  Eyes:  Positive for visual disturbance.  Neurological:  Positive for syncope.  All other systems reviewed and are negative.   Physical Exam Updated Vital Signs BP (!) 126/93   Pulse 64   Temp 98.1 F (36.7 C) (Oral)   Resp 10   SpO2 99%  Physical Exam Vitals and nursing note reviewed.  Constitutional:      Appearance: Normal appearance.  HENT:     Head: Normocephalic and atraumatic.     Right Ear: External ear normal.     Left Ear: External ear normal.     Nose: Nose normal.     Mouth/Throat:     Mouth: Mucous membranes are moist.     Pharynx: Oropharynx is  clear.  Eyes:     Extraocular Movements: Extraocular movements intact.     Conjunctiva/sclera: Conjunctivae normal.     Pupils: Pupils are equal, round, and reactive to light.     Right eye: No fluorescein uptake.     Comments: Pupils reactive.  Pt reports vision in his peripheral vision of his right eye, the rest is gray.  Cardiovascular:     Rate and Rhythm: Normal rate and regular rhythm.     Pulses: Normal pulses.     Heart sounds: Normal heart sounds.  Pulmonary:     Effort: Pulmonary effort is normal.     Breath sounds: Normal breath sounds.  Abdominal:     General: Abdomen is flat. Bowel sounds are normal.     Palpations: Abdomen is soft.  Musculoskeletal:        General: Normal range of motion.     Cervical back: Normal range of motion and neck supple.  Skin:    General: Skin is warm.     Capillary Refill: Capillary refill takes less than 2 seconds.  Neurological:  General: No focal deficit present.     Mental Status: He is alert and oriented to person, place, and time.  Psychiatric:        Mood and Affect: Mood normal.        Behavior: Behavior normal.     ED Results / Procedures / Treatments   Labs (all labs ordered are listed, but only abnormal results are displayed) Labs Reviewed  COMPREHENSIVE METABOLIC PANEL - Abnormal; Notable for the following components:      Result Value   Glucose, Bld 102 (*)    Calcium 8.5 (*)    Total Bilirubin 0.2 (*)    All other components within normal limits  CBC WITH DIFFERENTIAL/PLATELET - Abnormal; Notable for the following components:   WBC 3.4 (*)    RBC 3.51 (*)    Hemoglobin 12.1 (*)    HCT 34.6 (*)    MCH 34.5 (*)    Neutro Abs 1.4 (*)    All other components within normal limits  URINALYSIS, ROUTINE W REFLEX MICROSCOPIC - Abnormal; Notable for the following components:   Leukocytes,Ua TRACE (*)    All other components within normal limits  RAPID URINE DRUG SCREEN, HOSP PERFORMED - Abnormal; Notable for the  following components:   Tetrahydrocannabinol POSITIVE (*)    All other components within normal limits  MAGNESIUM  ETHANOL  CBG MONITORING, ED    EKG EKG Interpretation Date/Time:  Wednesday March 22 2023 11:50:42 EDT Ventricular Rate:  63 PR Interval:  152 QRS Duration:  80 QT Interval:  387 QTC Calculation: 397 R Axis:   77  Text Interpretation: Sinus rhythm ST elev, probable normal early repol pattern No significant change since last tracing Confirmed by Jacalyn Lefevre [id number]) on 03/22/2023 12:30:48 PM  Radiology DG Chest Port 1 View  Result Date: 03/22/2023 CLINICAL DATA:  Seizure. EXAM: PORTABLE CHEST 1 VIEW COMPARISON:  Chest radiographs 04/28/2017 FINDINGS: Cardiac silhouette and mediastinal contours are within normal limits. The lungs are clear. No pleural effusion or pneumothorax. No acute skeletal abnormality. IMPRESSION: No acute cardiopulmonary process. Electronically Signed   By: Neita Garnet M.D.   On: 03/22/2023 12:44   CT Head Wo Contrast  Result Date: 03/22/2023 CLINICAL DATA:  Provided history: Seizure, new onset, no history of trauma. Additional history provided: Syncope, possible seizure. EXAM: CT HEAD WITHOUT CONTRAST TECHNIQUE: Contiguous axial images were obtained from the base of the skull through the vertex without intravenous contrast. RADIATION DOSE REDUCTION: This exam was performed according to the departmental dose-optimization program which includes automated exposure control, adjustment of the mA and/or kV according to patient size and/or use of iterative reconstruction technique. COMPARISON:  Head CT 08/23/2016. FINDINGS: Brain: Cerebral volume is normal. There is no acute intracranial hemorrhage. No demarcated cortical infarct. No extra-axial fluid collection. No evidence of an intracranial mass. No midline shift. Vascular: No hyperdense vessel. Skull: No calvarial fracture or aggressive osseous lesion. Sinuses/Orbits: No mass or acute finding within the  imaged orbits. No significant paranasal sinus disease at the imaged levels. IMPRESSION: No evidence of an acute intracranial abnormality. Electronically Signed   By: Jackey Loge D.O.   On: 03/22/2023 12:37    Procedures Procedures    Medications Ordered in ED Medications  sodium chloride 0.9 % bolus 1,000 mL (1,000 mLs Intravenous New Bag/Given 03/22/23 1155)    And  0.9 %  sodium chloride infusion (has no administration in time range)  fluorescein ophthalmic strip 1 strip (1 strip Right Eye Given 03/22/23 1312)  tetracaine (PONTOCAINE) 0.5 % ophthalmic solution 2 drop (2 drops Right Eye Given 03/22/23 1312)    ED Course/ Medical Decision Making/ A&P                             Medical Decision Making Amount and/or Complexity of Data Reviewed Labs: ordered. Radiology: ordered.  Risk Prescription drug management.   This patient presents to the ED for concern of syncope, this involves an extensive number of treatment options, and is a complaint that carries with it a high risk of complications and morbidity.  The differential diagnosis includes syncope, seizure, electrolyte abn, head injury   Co morbidities that complicate the patient evaluation  Ibs and anxiety   Additional history obtained:  Additional history obtained from epic chart review External records from outside source obtained and reviewed including EMS/wife   Lab Tests:  I Ordered, and personally interpreted labs.  The pertinent results include:  cbc with hgb 12.1 (hgb 10.8 5 years ago); cmp nl, mg nl, etoh neg, uds + MJ   Imaging Studies ordered:  I ordered imaging studies including cxr, ct head, MRI brain I independently visualized and interpreted imaging which showed  CXR: No acute cardiopulmonary process.  CT head: No evidence of an acute intracranial abnormality.   I agree with the radiologist interpretation   Cardiac Monitoring:  The patient was maintained on a cardiac monitor.  I personally  viewed and interpreted the cardiac monitored which showed an underlying rhythm of: nsr   Medicines ordered and prescription drug management:  I ordered medication including ivfs  for sx  Reevaluation of the patient after these medicines showed that the patient improved I have reviewed the patients home medicines and have made adjustments as needed   Test Considered:  mri  Problem List / ED Course:  Syncope:  ? Dehydration Vision disturbances:  vision is improving.  Pt does not want to wait for MRI.  He is given the number for ophtho to f/u   Reevaluation:  After the interventions noted above, I reevaluated the patient and found that they have :improved   Social Determinants of Health:  Lives at home   Dispostion:  After consideration of the diagnostic results and the patients response to treatment, I feel that the patent would benefit from further eval, but he wants to leave.  He is having a disagreement with his wife.          Final Clinical Impression(s) / ED Diagnoses Final diagnoses:  Syncope, unspecified syncope type  Visual disturbance    Rx / DC Orders ED Discharge Orders     None         Jacalyn Lefevre, MD 03/22/23 1510

## 2023-03-22 NOTE — ED Triage Notes (Signed)
Pt BIB [facility] from home d/y possible syncopal episode that led to a syncopal episode. Pt was found in a parking lot after he dropped off his wife to work. Pt said he had a seizure episode. Possibly hit his head. No hx seizure.

## 2024-06-19 ENCOUNTER — Encounter (HOSPITAL_COMMUNITY): Payer: Self-pay

## 2024-06-19 ENCOUNTER — Other Ambulatory Visit: Payer: Self-pay

## 2024-06-19 ENCOUNTER — Emergency Department (HOSPITAL_COMMUNITY)
Admission: EM | Admit: 2024-06-19 | Discharge: 2024-06-19 | Disposition: A | Discharge status: Home / Self Care | Attending: Emergency Medicine | Admitting: Emergency Medicine

## 2024-06-19 DIAGNOSIS — L03213 Periorbital cellulitis: Secondary | ICD-10-CM | POA: Insufficient documentation

## 2024-06-19 MED ORDER — AMOXICILLIN-POT CLAVULANATE 875-125 MG PO TABS: 1.0000 | ORAL_TABLET | Freq: Two times a day (BID) | ORAL | 0 refills | Status: AC

## 2024-06-19 NOTE — ED Triage Notes (Signed)
 PT c/o swelling and pain to R upper lid since Sunday.  He has used hot compresses with no relief.

## 2024-06-19 NOTE — Discharge Instructions (Addendum)
 You have an infection of your eyelid and need to start antibiotics.  Alternate between warm and cool compresses.  I would expect symptoms should start improving in the next 2 to 3 days.  However if you start running fevers, it starts becoming very painful for you to move the eyeball or if your symptoms become progressive return to the emergency room

## 2024-06-19 NOTE — ED Notes (Signed)
Patient Alert and oriented to baseline. Stable and ambulatory to baseline. Patient verbalized understanding of the discharge instructions.  Patient belongings were taken by the patient.  a 

## 2024-06-19 NOTE — ED Provider Notes (Signed)
 [place] EMERGENCY DEPARTMENT AT [facility] Provider Note   CSN: [id number] Arrival date & time: 06/19/24  9162     Patient presents with: Eye Drainage   [name] [name] is a 38 y.o. male.   Patient is a 38 year old male with no significant medical problems who is presenting today with pain and swelling of the right upper eyelid.  Patient reports the symptoms started on Sunday and have been gradually worsening.  No specific vision changes and no pain with movement of the eye.  He has not had any excessive drainage from the eye and no recent injuries.  He does not use contacts.  He felt a little chilled last night but has otherwise not had specific fevers.  He has been trying warm compresses at home.  The history is provided by the patient.       Prior to Admission medications   Medication Sig Start Date End Date Taking? Authorizing Provider  amoxicillin-clavulanate (AUGMENTIN) 875-125 MG tablet Take 1 tablet by mouth every 12 (twelve) hours. 06/19/24  Yes Doretha Folks, MD  guaiFENesin -dextromethorphan (ROBITUSSIN DM) 100-10 MG/5ML syrup Take 10 mLs by mouth every 6 (six) hours as needed for cough. 04/29/17   Cheryle Page, MD    Allergies: Tomato    Review of Systems  Updated Vital Signs BP (!) 139/95 (BP Location: Right Arm)   Pulse 72   Temp 98.2 F (36.8 C)   Resp 18   Ht 5' 7 (1.702 m)   Wt 69.9 kg   SpO2 100%   BMI 24.12 kg/m   Physical Exam Vitals reviewed.  HENT:     Head: Normocephalic.     Nose: Nose normal.  Eyes:     Extraocular Movements: Extraocular movements intact.     Pupils: Pupils are equal, round, and reactive to light.     Comments: No pain in the eye with extraocular movements.  Right upper eyelid is erythematous and swollen.  No induration or fluctuance noted.  No vesicles noted.  No exudate or conjunctival injection of the right eye  Cardiovascular:     Rate and Rhythm: Normal rate.  Pulmonary:     Effort: Pulmonary effort  is normal.  Skin:    General: Skin is warm.  Neurological:     Mental Status: He is alert. Mental status is at baseline.  Psychiatric:        Mood and Affect: Mood normal.     (all labs ordered are listed, but only abnormal results are displayed) Labs Reviewed - No data to display  EKG: None  Radiology: No results found.   Procedures   Medications Ordered in the ED - No data to display                                  Medical Decision Making Risk Prescription drug management.   Patient presenting today with findings concerning for preseptal cellulitis of the right upper eyelid.  No eye involvement concerning for orbital cellulitis at this time.  Patient is otherwise well-appearing has no risk factors of diabetes or other immunocompromise state.  At this time patient was prescribed antibiotics and given return precautions.     Final diagnoses:  Preseptal cellulitis of right upper eyelid    ED Discharge Orders          Ordered    amoxicillin-clavulanate (AUGMENTIN) 875-125 MG tablet  Every 12 hours  06/19/24 0935               Doretha Folks, MD 06/19/24 [phone]

## 2024-09-25 ENCOUNTER — Emergency Department (HOSPITAL_COMMUNITY): Payer: Worker's Compensation

## 2024-09-25 ENCOUNTER — Emergency Department (HOSPITAL_COMMUNITY)
Admission: EM | Admit: 2024-09-25 | Discharge: 2024-09-26 | Disposition: A | Discharge status: Home / Self Care | Payer: Worker's Compensation | Attending: Emergency Medicine | Admitting: Emergency Medicine

## 2024-09-25 DIAGNOSIS — M25521 Pain in right elbow: Secondary | ICD-10-CM | POA: Diagnosis present
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
## 2024-09-26 MED ORDER — IBUPROFEN 800 MG PO TABS
800.0000 mg | ORAL_TABLET | Freq: Once | ORAL | Status: AC
  Administered 2024-09-26: 800 mg via ORAL
  Filled 2024-09-26: qty 1

## 2024-09-26 NOTE — ED Notes (Signed)
 Registration at bedside.

## 2024-09-26 NOTE — Discharge Instructions (Signed)
 Home to rest, motrin  and tylenol  as needed as directed. Recheck with workers comp provider in 2 days if not improving.
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
